# Patient Record
Sex: Female | Born: 1980 | Race: White | Hispanic: No | Marital: Married | Smoking: Never smoker
Health system: Southern US, Community
[De-identification: ages and names within clinical notes are randomized; demographics above are authoritative.]

## PROBLEM LIST (undated history)

## (undated) ENCOUNTER — Inpatient Hospital Stay (HOSPITAL_COMMUNITY): Payer: Self-pay

## (undated) DIAGNOSIS — G43909 Migraine, unspecified, not intractable, without status migrainosus: Secondary | ICD-10-CM

## (undated) DIAGNOSIS — O09529 Supervision of elderly multigravida, unspecified trimester: Secondary | ICD-10-CM

## (undated) DIAGNOSIS — Z8619 Personal history of other infectious and parasitic diseases: Secondary | ICD-10-CM

## (undated) HISTORY — DX: Personal history of other infectious and parasitic diseases: Z86.19

## (undated) HISTORY — DX: Migraine, unspecified, not intractable, without status migrainosus: G43.909

## (undated) HISTORY — DX: Supervision of elderly multigravida, unspecified trimester: O09.529

## (undated) HISTORY — PX: WISDOM TOOTH EXTRACTION: SHX21

---

## 2003-02-15 ENCOUNTER — Emergency Department (HOSPITAL_COMMUNITY): Admission: EM | Admit: 2003-02-15 | Discharge: 2003-02-15 | Payer: Self-pay | Admitting: Emergency Medicine

## 2010-04-29 ENCOUNTER — Other Ambulatory Visit: Admission: RE | Admit: 2010-04-29 | Discharge: 2010-04-29 | Payer: Self-pay | Admitting: Gynecology

## 2010-04-29 ENCOUNTER — Ambulatory Visit: Payer: Self-pay | Admitting: Gynecology

## 2010-05-05 ENCOUNTER — Ambulatory Visit: Payer: Self-pay | Admitting: Gynecology

## 2010-05-20 ENCOUNTER — Emergency Department (HOSPITAL_COMMUNITY): Admission: EM | Admit: 2010-05-20 | Discharge: 2010-05-20 | Payer: Self-pay | Admitting: Emergency Medicine

## 2012-02-15 ENCOUNTER — Encounter: Payer: Self-pay | Admitting: *Deleted

## 2012-02-16 ENCOUNTER — Other Ambulatory Visit (HOSPITAL_COMMUNITY)
Admission: RE | Admit: 2012-02-16 | Discharge: 2012-02-16 | Disposition: A | Discharge status: Home / Self Care | Payer: BC Managed Care – PPO | Source: Ambulatory Visit | Attending: Gynecology | Admitting: Gynecology

## 2012-02-16 ENCOUNTER — Ambulatory Visit (INDEPENDENT_AMBULATORY_CARE_PROVIDER_SITE_OTHER): Payer: BC Managed Care – PPO | Admitting: Gynecology

## 2012-02-16 ENCOUNTER — Encounter: Payer: Self-pay | Admitting: Gynecology

## 2012-02-16 VITALS — BP 112/64 | Ht 66.5 in | Wt 149.0 lb

## 2012-02-16 DIAGNOSIS — Z01419 Encounter for gynecological examination (general) (routine) without abnormal findings: Secondary | ICD-10-CM

## 2012-02-16 DIAGNOSIS — Z131 Encounter for screening for diabetes mellitus: Secondary | ICD-10-CM

## 2012-02-16 DIAGNOSIS — Z13220 Encounter for screening for lipoid disorders: Secondary | ICD-10-CM

## 2012-02-16 DIAGNOSIS — Z1151 Encounter for screening for human papillomavirus (HPV): Secondary | ICD-10-CM | POA: Insufficient documentation

## 2012-02-16 LAB — CBC WITH DIFFERENTIAL/PLATELET
Basophils Absolute: 0.1 10*3/uL (ref 0.0–0.1)
Basophils Relative: 1 % (ref 0–1)
Eosinophils Relative: 2 % (ref 0–5)
Lymphocytes Relative: 26 % (ref 12–46)
MCV: 96.9 fL (ref 78.0–100.0)
Neutro Abs: 3.3 10*3/uL (ref 1.7–7.7)
Platelets: 405 10*3/uL — ABNORMAL HIGH (ref 150–400)
RDW: 13.5 % (ref 11.5–15.5)
WBC: 5.8 10*3/uL (ref 4.0–10.5)

## 2012-02-16 LAB — LIPID PANEL
LDL Cholesterol: 165 mg/dL — ABNORMAL HIGH (ref 0–99)
Triglycerides: 50 mg/dL (ref <150)

## 2012-02-16 NOTE — Patient Instructions (Signed)
Call us when you're ready to start BCPs. Follow up in one year for annual gynecologic exam.  Oral Contraception Use Oral contraceptives (OCs) are medicines taken to prevent pregnancy. OCs work by preventing the ovaries from releasing eggs. The hormones in OCs also cause the cervical mucus to thicken, preventing the sperm from entering the uterus. The hormones also cause the uterine lining to become thin, not allowing a fertilized egg to attach to the inside of the uterus. OCs are highly effective when taken exactly as prescribed. However, OCs do not prevent sexually transmitted diseases (STDs). Safe sex practices, such as using condoms along with an OC, can help prevent STDs.  Before taking OCs, you may have a physical exam and Pap test. Your caregiver may also order blood tests if necessary. Your caregiver will make sure you are a good candidate for oral contraception. Discuss with your caregiver the possible side effects of the OC you may be prescribed. When starting an OC, it can take 2 to 3 months for the body to adjust to the changes in hormone levels in your body.  HOW TO TAKE ORAL CONTRACEPTIVES Your caregiver may advise you on how to start taking the first cycle of OCs. Otherwise, you can:  Start on day 1 of your menstrual period. You will not need any backup contraceptive protection with this start time.   Start on the first Sunday after your menstrual period or the day you get your prescription. In these cases, you will need to use backup contraceptive protection for the first 7-day cycle.  After you have started taking OCs:  If you forget to take 1 pill, take it as soon as you remember. Take the next pill at the regular time.   If you miss 2 or more pills, use backup birth control until your next menstrual period starts.   If you use a 28-day pack that contains inactive pills and you miss 1 of the last 7 pills (pills with no hormones), it will not matter. Throw away the rest of the  non-hormone pills and start a new pill pack.  No matter which day you start the OC, you will always start a new pack on that same day of the week. Have an extra pack of OCs and a backup contraceptive method available in case you miss some pills or lose your OC pack. HOME CARE INSTRUCTIONS   Do not smoke.   Always use a condom to protect against STDs. OCs do not protect against STDs.   Use a calendar to mark your menstrual period days.   Read the information and directions that come with your OC. Talk to your caregiver if you have questions.  SEEK MEDICAL CARE IF:   You develop nausea and vomiting.   You have abnormal vaginal discharge or bleeding.   You develop a rash.   You miss your menstrual period.   You are losing your hair.   You need treatment for mood swings or depression.   You get dizzy when taking the OC.   You develop acne from taking the OC.   You become pregnant.  SEEK IMMEDIATE MEDICAL CARE IF:   You develop chest pain.   You develop shortness of breath.   You have an uncontrolled or severe headache.   You develop numbness or slurred speech.   You develop visual problems.   You develop pain, redness, and swelling in the legs.  Document Released: 06/22/2011 Document Reviewed: 06/20/2011 [facility] Patient Information 2012 [employer]. 

## 2012-02-16 NOTE — Progress Notes (Signed)
Debra Larsen [birth year] [id number]        31 y.o.  G0P0 for annual exam.  Without complaints.  Past medical history,surgical history, medications, allergies, family history and social history were all reviewed and documented in the EPIC chart. ROS:  Was performed and pertinent positives and negatives are included in the history.  Exam: Sherrilyn Rist assistant Filed Vitals:   02/16/12 0937  BP: 112/64  Height: 5' 6.5" (1.689 m)  Weight: 149 lb (67.586 kg)   General appearance  Normal Skin grossly normal Head/Neck normal with no cervical or supraclavicular adenopathy thyroid normal Lungs  clear Cardiac RR, without RMG Abdominal  soft, nontender, without masses, organomegaly or hernia Breasts  examined lying and sitting without masses, retractions, discharge or axillary adenopathy. Pelvic  Ext/BUS/vagina  normal   Cervix  normal Pap/HPV  Uterus  anteverted, normal size, shape and contour, midline and mobile nontender   Adnexa  Without masses or tenderness    Anus and perineum  normal   Rectovaginal  normal sphincter tone without palpated masses or tenderness.    Assessment/Plan:  31 y.o. G0P0 female for annual exam, regular menses.   1. Contraceptive counseling. Patient currently not sexually active but wants to start BCPs sometime this year. I reviewed all options for contraception to include pill patch ring Depo-Provera Implanon IUD. Side effect profile and risks of BCPs reviewed to include stroke heart attack DVT. We'll plan on Loestrin 120 of 1. She does not want prescribed now but will call us during the year she is ready to start to be prescribed. Sunday start/first a start reviewed. Every other month withdrawal options/offbrand labeling reviewed. 2. Breast health. Ischemically reviewed. 3. Pap smear. Pap/HPV done today. No history of abnormal Paps before. If negative then plan every 5 year screening. 4. Health maintenance. Baseline CBC glucose lipid profile urinalysis done today.  Follow up in one year, sooner as needed.    [name], 10:02 AM 02/16/2012

## 2012-02-17 LAB — URINALYSIS W MICROSCOPIC + REFLEX CULTURE
Casts: NONE SEEN
Crystals: NONE SEEN
Ketones, ur: NEGATIVE mg/dL
Nitrite: NEGATIVE
Specific Gravity, Urine: 1.012 (ref 1.005–1.030)
pH: 7.5 (ref 5.0–8.0)

## 2012-02-18 LAB — URINE CULTURE

## 2012-02-19 ENCOUNTER — Telehealth: Payer: Self-pay | Admitting: Gynecology

## 2012-02-19 DIAGNOSIS — E7800 Pure hypercholesterolemia, unspecified: Secondary | ICD-10-CM

## 2012-02-19 DIAGNOSIS — Z13220 Encounter for screening for lipoid disorders: Secondary | ICD-10-CM

## 2012-02-19 NOTE — Telephone Encounter (Signed)
Tell patient 1. Cholesterol and LDL are too high. Need to recheck a fasting lipid profile. 2. Urinalysis contaminated and recommend repeating a clean catch urinalysis. She can do this when she comes in for her lipid profile.

## 2012-02-19 NOTE — Telephone Encounter (Signed)
Left message for pt to call.

## 2012-02-19 NOTE — Telephone Encounter (Signed)
Patient called in voice mail returning call.  I called her back and told her I needed to discuss her lipid profile and u/a results.

## 2012-02-21 NOTE — Telephone Encounter (Signed)
Pt informed with the below note, orders placed in computer.

## 2013-02-27 ENCOUNTER — Encounter: Payer: Self-pay | Admitting: Gynecology

## 2013-02-27 ENCOUNTER — Ambulatory Visit (INDEPENDENT_AMBULATORY_CARE_PROVIDER_SITE_OTHER): Payer: BC Managed Care – PPO | Admitting: Gynecology

## 2013-02-27 VITALS — BP 116/70 | Ht 67.0 in | Wt 145.0 lb

## 2013-02-27 DIAGNOSIS — Z01419 Encounter for gynecological examination (general) (routine) without abnormal findings: Secondary | ICD-10-CM

## 2013-02-27 DIAGNOSIS — Z113 Encounter for screening for infections with a predominantly sexual mode of transmission: Secondary | ICD-10-CM

## 2013-02-27 DIAGNOSIS — Z13220 Encounter for screening for lipoid disorders: Secondary | ICD-10-CM

## 2013-02-27 LAB — HEPATITIS B SURFACE ANTIGEN: Hepatitis B Surface Ag: NEGATIVE

## 2013-02-27 LAB — CBC WITH DIFFERENTIAL/PLATELET
HCT: 41.8 % (ref 36.0–46.0)
Hemoglobin: 14 g/dL (ref 12.0–15.0)
Lymphocytes Relative: 38 % (ref 12–46)
Monocytes Absolute: 0.5 10*3/uL (ref 0.1–1.0)
Monocytes Relative: 12 % (ref 3–12)
Neutro Abs: 2 10*3/uL (ref 1.7–7.7)
Neutrophils Relative %: 45 % (ref 43–77)
RBC: 4.48 MIL/uL (ref 3.87–5.11)
WBC: 4.3 10*3/uL (ref 4.0–10.5)

## 2013-02-27 LAB — LIPID PANEL
HDL: 66 mg/dL (ref >39)
LDL Cholesterol: 170 mg/dL — ABNORMAL HIGH (ref 0–99)
Triglycerides: 46 mg/dL (ref <150)

## 2013-02-27 LAB — COMPREHENSIVE METABOLIC PANEL
AST: 13 U/L (ref 0–37)
Albumin: 4.3 g/dL (ref 3.5–5.2)
Alkaline Phosphatase: 49 U/L (ref 39–117)
BUN: 10 mg/dL (ref 6–23)
Calcium: 9.2 mg/dL (ref 8.4–10.5)
Chloride: 102 mEq/L (ref 96–112)
Glucose, Bld: 76 mg/dL (ref 70–99)
Potassium: 4.1 mEq/L (ref 3.5–5.3)
Sodium: 138 mEq/L (ref 135–145)
Total Protein: 7 g/dL (ref 6.0–8.3)

## 2013-02-27 LAB — RPR

## 2013-02-27 LAB — VITAMIN B12: Vitamin B-12: 980 pg/mL — ABNORMAL HIGH (ref 211–911)

## 2013-02-27 LAB — HIV ANTIBODY (ROUTINE TESTING W REFLEX): HIV: NONREACTIVE

## 2013-02-27 NOTE — Patient Instructions (Signed)
Check for your lab results on My Chart. Followup in one year for annual exam.

## 2013-02-27 NOTE — Addendum Note (Signed)
Addended by: Dara Lords on: 02/27/2013 12:10 PM   Modules accepted: Level of Service

## 2013-02-27 NOTE — Progress Notes (Signed)
Debra Larsen January 29, 1981 [id number]        32 y.o.  G0P0 for annual exam.  Doing well without complaints.  Past medical history,surgical history, medications, allergies, family history and social history were all reviewed and documented in the EPIC chart.  ROS:  Performed and pertinent positives and negatives are included in the history, assessment and plan .  Exam: [name] assistant Filed Vitals:   02/27/13 1134  BP: 116/70  Height: 5\' 7"  (1.702 m)  Weight: 145 lb (65.772 kg)   General appearance  Normal Skin grossly normal Head/Neck normal with no cervical or supraclavicular adenopathy thyroid normal Lungs  clear Cardiac RR, without RMG Abdominal  soft, nontender, without masses, organomegaly or hernia Breasts  examined lying and sitting without masses, retractions, discharge or axillary adenopathy. Pelvic  Ext/BUS/vagina  normal   Cervix  normal   Uterus  anteverted, normal size, shape and contour, midline and mobile nontender   Adnexa  Without masses or tenderness    Anus and perineum  normal      Assessment/Plan:  32 y.o. G0P0 female for annual exam, regular menses, condom contraception.   1. Birth control. Patient had initially planned on starting low-dose oral contraceptives this past year but never did that. She continues to use condoms. I reviewed the failure risk with these and we discussed birth control options. She declines and is comfortable with condoms. Availability of plan B. also discussed. 2. Hypercholesterolemia. Patient's cholesterol was elevated last year and she was asked to come back for fasting value but she never did. We'll recheck a fasting lipid profile today. 3. Pap smear/HPV 2013 normal. No history of abnormal Pap smears previously. Plan repeat Pap smear in 3-5 year interval. 4. STD screening requested. No known exposure but wants to be screened. GC/Chlamydia, HIV, RPR, hepatitis B, hepatitis C ordered. Issues with HSV/HPV discussed. 5. SBE monthly  reviewed. 6. Health maintenance. Baseline CBC comprehensive metabolic panel lipid profile urinalysis ordered. Patient requested vitamin D and B12 levels to be checked also. Followup one year, sooner as needed.  Note: This document was prepared with digital dictation and possible smart phrase technology. Any transcriptional errors that result from this process are unintentional.   [name], 11:58 AM 02/27/2013

## 2013-02-28 ENCOUNTER — Other Ambulatory Visit: Payer: Self-pay | Admitting: Gynecology

## 2013-02-28 DIAGNOSIS — E7800 Pure hypercholesterolemia, unspecified: Secondary | ICD-10-CM

## 2013-02-28 LAB — URINALYSIS W MICROSCOPIC + REFLEX CULTURE
Casts: NONE SEEN
Crystals: NONE SEEN
Glucose, UA: NEGATIVE mg/dL
Hgb urine dipstick: NEGATIVE
Leukocytes, UA: NEGATIVE
Nitrite: NEGATIVE
Specific Gravity, Urine: 1.005 (ref 1.005–1.030)
pH: 7 (ref 5.0–8.0)

## 2013-02-28 LAB — VITAMIN D 25 HYDROXY (VIT D DEFICIENCY, FRACTURES): Vit D, 25-Hydroxy: 38 ng/mL (ref 30–89)

## 2013-02-28 LAB — GC/CHLAMYDIA PROBE AMP: CT Probe RNA: NEGATIVE

## 2013-03-03 ENCOUNTER — Other Ambulatory Visit: Payer: BC Managed Care – PPO

## 2013-03-19 ENCOUNTER — Other Ambulatory Visit: Payer: BC Managed Care – PPO

## 2013-05-22 ENCOUNTER — Other Ambulatory Visit: Payer: Self-pay

## 2014-05-27 LAB — OB RESULTS CONSOLE ABO/RH: RH TYPE: POSITIVE

## 2014-05-27 LAB — OB RESULTS CONSOLE HEPATITIS B SURFACE ANTIGEN: Hepatitis B Surface Ag: NEGATIVE

## 2014-05-27 LAB — OB RESULTS CONSOLE RUBELLA ANTIBODY, IGM: Rubella: IMMUNE

## 2014-05-27 LAB — OB RESULTS CONSOLE GC/CHLAMYDIA
Chlamydia: NEGATIVE
GC PROBE AMP, GENITAL: NEGATIVE

## 2014-05-27 LAB — OB RESULTS CONSOLE HIV ANTIBODY (ROUTINE TESTING): HIV: NONREACTIVE

## 2014-05-27 LAB — OB RESULTS CONSOLE ANTIBODY SCREEN: ANTIBODY SCREEN: NEGATIVE

## 2014-05-27 LAB — OB RESULTS CONSOLE RPR: RPR: NONREACTIVE

## 2014-06-16 ENCOUNTER — Other Ambulatory Visit: Payer: Self-pay | Admitting: Obstetrics & Gynecology

## 2014-06-17 LAB — CYTOLOGY - PAP

## 2014-07-17 NOTE — L&D Delivery Note (Signed)
Delivery Note At 2:45 PM a viable female was delivered via OA PResentation Apgars 9 9    Placenta status:spontaneously and intact , .  Cord:  with the following complications:none .  Cord pH: not obtained  Anesthesia: epidural   Episiotomy:none   Lacerations:  2nd Suture Repair: 3.0 chromic Est. Blood Loss (mL):  400  Mom to postpartum.  Baby to Couplet care / Skin to Skin.  Debra Larsen L 11/21/2014, 2:55 PM

## 2014-10-04 ENCOUNTER — Inpatient Hospital Stay (HOSPITAL_COMMUNITY)
Admission: AD | Admit: 2014-10-04 | Discharge: 2014-10-04 | Disposition: A | Discharge status: Home / Self Care | Payer: BC Managed Care – PPO | Source: Ambulatory Visit | Attending: Obstetrics and Gynecology | Admitting: Obstetrics and Gynecology

## 2014-10-04 ENCOUNTER — Encounter (HOSPITAL_COMMUNITY): Payer: Self-pay | Admitting: *Deleted

## 2014-10-04 DIAGNOSIS — O9989 Other specified diseases and conditions complicating pregnancy, childbirth and the puerperium: Secondary | ICD-10-CM | POA: Diagnosis not present

## 2014-10-04 DIAGNOSIS — N898 Other specified noninflammatory disorders of vagina: Secondary | ICD-10-CM | POA: Insufficient documentation

## 2014-10-04 DIAGNOSIS — O26893 Other specified pregnancy related conditions, third trimester: Secondary | ICD-10-CM

## 2014-10-04 DIAGNOSIS — Z3A33 33 weeks gestation of pregnancy: Secondary | ICD-10-CM | POA: Insufficient documentation

## 2014-10-04 LAB — URINALYSIS, ROUTINE W REFLEX MICROSCOPIC
BILIRUBIN URINE: NEGATIVE
Glucose, UA: NEGATIVE mg/dL
HGB URINE DIPSTICK: NEGATIVE
KETONES UR: NEGATIVE mg/dL
NITRITE: NEGATIVE
PH: 6 (ref 5.0–8.0)
PROTEIN: NEGATIVE mg/dL
Specific Gravity, Urine: <1.005 — ABNORMAL LOW (ref 1.005–1.030)
Urobilinogen, UA: 0.2 mg/dL (ref 0.0–1.0)

## 2014-10-04 LAB — URINE MICROSCOPIC-ADD ON

## 2014-10-04 NOTE — MAU Note (Signed)
Has noticed increased leaking over past few days.

## 2014-10-04 NOTE — MAU Provider Note (Signed)
History     CSN: [id number]  Arrival date and time: 10/04/14 1046   First Provider Initiated Contact with Patient 10/04/14 1134      Chief Complaint  Patient presents with  . leakage of fluid    HPI 34 y.o. G1P0 at 8063w5d w/ increased vaginal discharge only in the morning for the past few days. No pain or bleeding, + fetal movement, uncomplicated prenatal course.   Past Medical History  Diagnosis Date  . Migraine     History reviewed. No pertinent past surgical history.  Family History  Problem Relation Age of Onset  . Diabetes Maternal Grandmother     History  Substance Use Topics  . Smoking status: Never Smoker   . Smokeless tobacco: Never Used  . Alcohol Use: Yes     Comment: Rare    Allergies:  Allergies  Allergen Reactions  . Other     Family HX of reactions to vaccines--Pt.chooses not to be vaccinated due to HX--Severe nerve damage in mother     Prescriptions prior to admission  Medication Sig Dispense Refill Last Dose  . Multiple Vitamin (MULTIVITAMIN) tablet Take 1 tablet by mouth daily.   Taking    Review of Systems  Constitutional: Negative.   Respiratory: Negative.   Cardiovascular: Negative.   Gastrointestinal: Negative for nausea, vomiting, abdominal pain, diarrhea and constipation.  Genitourinary: Negative for dysuria, urgency, frequency, hematuria and flank pain.       Negative for vaginal bleeding, cramping/contractions, + discharge   Musculoskeletal: Negative.   Neurological: Negative.   Psychiatric/Behavioral: Negative.    Physical Exam   Blood pressure 115/75, pulse 70, temperature 98.1 F (36.7 C), temperature source Oral, resp. rate 18, height 5\' 7"  (1.702 m), weight 171 lb (77.565 kg).  Physical Exam  Nursing note and vitals reviewed. Constitutional: She is oriented to person, place, and time. She appears well-developed and well-nourished. No distress.  HENT:  Head: Normocephalic and atraumatic.  Cardiovascular: Normal rate.    Respiratory: Effort normal.  GI: Soft. She exhibits no mass. There is no tenderness. There is no rebound and no guarding.  Genitourinary: There is no rash or lesion on the right labia. There is no rash or lesion on the left labia. Uterus is not tender. Enlarged: Size c/w dates. No tenderness or bleeding in the vagina. Vaginal discharge (small mucous) found.  Cervix visually closed   Musculoskeletal: Normal range of motion.  Neurological: She is alert and oriented to person, place, and time.  Skin: Skin is warm and dry.  Psychiatric: She has a normal mood and affect.    MAU Course  Procedures Results for orders placed or performed during the hospital encounter of 10/04/14 (from the past 24 hour(s))  Urinalysis, Routine w reflex microscopic     Status: Abnormal   Collection Time: 10/04/14 10:55 AM  Result Value Ref Range   Color, Urine YELLOW YELLOW   APPearance CLEAR CLEAR   Specific Gravity, Urine <1.005 (L) 1.005 - 1.030   pH 6.0 5.0 - 8.0   Glucose, UA NEGATIVE NEGATIVE mg/dL   Hgb urine dipstick NEGATIVE NEGATIVE   Bilirubin Urine NEGATIVE NEGATIVE   Ketones, ur NEGATIVE NEGATIVE mg/dL   Protein, ur NEGATIVE NEGATIVE mg/dL   Urobilinogen, UA 0.2 0.0 - 1.0 mg/dL   Nitrite NEGATIVE NEGATIVE   Leukocytes, UA SMALL (A) NEGATIVE  Urine microscopic-add on     Status: Abnormal   Collection Time: 10/04/14 10:55 AM  Result Value Ref Range   Squamous Epithelial /  LPF FEW (A) RARE   WBC, UA 3-6 <3 WBC/hpf   RBC / HPF 0-2 <3 RBC/hpf   Bacteria, UA FEW (A) RARE   FERN: NEGATIVE  Assessment and Plan   1. Vaginal discharge in pregnancy, third trimester   No evidence of SROM, precautions rev'd.     Medication List    TAKE these medications        multivitamin tablet  Take 1 tablet by mouth daily.            Follow-up Information    Follow up with ADKINS,GRETCHEN, MD.   Specialty:  Obstetrics and Gynecology   Why:  as scheduled or sooner as needed   Contact  information:   [address] [place], [address] [place] [address] [phone]         [facility] 10/04/2014, 11:37 AM

## 2014-11-13 ENCOUNTER — Telehealth (HOSPITAL_COMMUNITY): Payer: Self-pay | Admitting: *Deleted

## 2014-11-13 ENCOUNTER — Encounter (HOSPITAL_COMMUNITY): Payer: Self-pay | Admitting: *Deleted

## 2014-11-13 LAB — OB RESULTS CONSOLE GBS: STREP GROUP B AG: NEGATIVE

## 2014-11-13 NOTE — Telephone Encounter (Signed)
Preadmission screen  

## 2014-11-20 ENCOUNTER — Inpatient Hospital Stay (HOSPITAL_COMMUNITY)
Admission: AD | Admit: 2014-11-20 | Discharge: 2014-11-22 | DRG: 775 | Disposition: A | Discharge status: Home / Self Care | Payer: BC Managed Care – PPO | Source: Ambulatory Visit | Attending: Obstetrics and Gynecology | Admitting: Obstetrics and Gynecology

## 2014-11-20 ENCOUNTER — Encounter (HOSPITAL_COMMUNITY): Payer: Self-pay | Admitting: *Deleted

## 2014-11-20 ENCOUNTER — Inpatient Hospital Stay (HOSPITAL_COMMUNITY): Admission: RE | Admit: 2014-11-20 | Payer: BC Managed Care – PPO | Source: Ambulatory Visit

## 2014-11-20 DIAGNOSIS — Z3403 Encounter for supervision of normal first pregnancy, third trimester: Secondary | ICD-10-CM | POA: Diagnosis present

## 2014-11-20 DIAGNOSIS — Z3A40 40 weeks gestation of pregnancy: Secondary | ICD-10-CM | POA: Diagnosis present

## 2014-11-20 DIAGNOSIS — O480 Post-term pregnancy: Principal | ICD-10-CM | POA: Diagnosis present

## 2014-11-20 LAB — CBC
HCT: 37 % (ref 36.0–46.0)
Hemoglobin: 12.6 g/dL (ref 12.0–15.0)
MCH: 32.2 pg (ref 26.0–34.0)
MCHC: 34.1 g/dL (ref 30.0–36.0)
MCV: 94.6 fL (ref 78.0–100.0)
Platelets: 288 10*3/uL (ref 150–400)
RBC: 3.91 MIL/uL (ref 3.87–5.11)
RDW: 15.2 % (ref 11.5–15.5)
WBC: 11.4 10*3/uL — ABNORMAL HIGH (ref 4.0–10.5)

## 2014-11-20 LAB — TYPE AND SCREEN
ABO/RH(D): O POS
Antibody Screen: NEGATIVE

## 2014-11-20 LAB — ABO/RH: ABO/RH(D): O POS

## 2014-11-20 MED ORDER — TERBUTALINE SULFATE 1 MG/ML IJ SOLN: 0.2500 mg | Freq: Once | INTRAMUSCULAR | Status: AC | PRN

## 2014-11-20 MED ORDER — OXYTOCIN 40 UNITS IN LACTATED RINGERS INFUSION - SIMPLE MED: 62.5000 mL/h | INTRAVENOUS | Status: DC

## 2014-11-20 MED ORDER — LACTATED RINGERS IV SOLN
INTRAVENOUS | Status: DC
Start: 2014-11-20 — End: 2014-11-21
  Administered 2014-11-21 (×2): via INTRAVENOUS

## 2014-11-20 MED ORDER — ZOLPIDEM TARTRATE 5 MG PO TABS: 5.0000 mg | ORAL_TABLET | Freq: Every evening | ORAL | Status: DC | PRN

## 2014-11-20 MED ORDER — LACTATED RINGERS IV SOLN
500.0000 mL | INTRAVENOUS | Status: DC | PRN
  Administered 2014-11-21: 500 mL via INTRAVENOUS

## 2014-11-20 MED ORDER — OXYCODONE-ACETAMINOPHEN 5-325 MG PO TABS: 2.0000 | ORAL_TABLET | ORAL | Status: DC | PRN

## 2014-11-20 MED ORDER — MISOPROSTOL 25 MCG QUARTER TABLET
25.0000 ug | ORAL_TABLET | ORAL | Status: DC
Start: 2014-11-20 — End: 2014-11-21
  Administered 2014-11-20 (×2): 25 ug via VAGINAL
  Filled 2014-11-20 (×3): qty 0.25

## 2014-11-20 MED ORDER — ACETAMINOPHEN 325 MG PO TABS: 650.0000 mg | ORAL_TABLET | ORAL | Status: DC | PRN

## 2014-11-20 MED ORDER — OXYTOCIN BOLUS FROM INFUSION
500.0000 mL | INTRAVENOUS | Status: DC
  Administered 2014-11-21: 500 mL via INTRAVENOUS

## 2014-11-20 MED ORDER — LIDOCAINE HCL (PF) 1 % IJ SOLN
30.0000 mL | INTRAMUSCULAR | Status: DC | PRN
  Administered 2014-11-21: 30 mL via SUBCUTANEOUS
  Filled 2014-11-20: qty 30

## 2014-11-20 MED ORDER — ONDANSETRON HCL 4 MG/2ML IJ SOLN: 4.0000 mg | Freq: Four times a day (QID) | INTRAMUSCULAR | Status: DC | PRN

## 2014-11-20 MED ORDER — OXYCODONE-ACETAMINOPHEN 5-325 MG PO TABS: 1.0000 | ORAL_TABLET | ORAL | Status: DC | PRN

## 2014-11-20 MED ORDER — CITRIC ACID-SODIUM CITRATE 334-500 MG/5ML PO SOLN
30.0000 mL | ORAL | Status: DC | PRN
  Administered 2014-11-21: 30 mL via ORAL
  Filled 2014-11-20: qty 15

## 2014-11-20 NOTE — MAU Note (Signed)
States she noted a gush of fluid around 1000. Having some contractions at night. States she is for induction tonight. GBS negative.

## 2014-11-20 NOTE — MAU Provider Note (Signed)
  History     CSN: [id number]  Arrival date and time: 11/20/14 1128   None     Chief Complaint  Patient presents with  . ? leaking    HPI 34 y.o. G1P0 @ 1171w3d presents to MAU stating that she had a gush of luid earlier today and thinks her water may be broke. No vaginal bleeding, contractions. Reports positive fetal movement. Pt did report she had sex last night  Past Medical History  Diagnosis Date  . Migraine   . Hx of varicella     Past Surgical History  Procedure Laterality Date  . Wisdom tooth extraction      Family History  Problem Relation Age of Onset  . Diabetes Maternal Grandmother     History  Substance Use Topics  . Smoking status: Never Smoker   . Smokeless tobacco: Never Used  . Alcohol Use: Yes     Comment: Rare    Allergies:  Allergies  Allergen Reactions  . Other     Family HX of reactions to vaccines--Pt.chooses not to be vaccinated due to HX--Severe nerve damage in mother     Prescriptions prior to admission  Medication Sig Dispense Refill Last Dose  . Prenatal Vit-Fe Fumarate-FA (PRENATAL MULTIVITAMIN) TABS tablet Take 1 tablet by mouth daily at 12 noon.   11/19/2014 at Unknown time    Review of Systems  Genitourinary:       Vaginal discharge  All other systems reviewed and are negative.  Physical Exam   Blood pressure 123/72, pulse 83, temperature 98.2 F (36.8 C), temperature source Oral, resp. rate 18, height 5\' 7"  (1.702 m), weight 80.287 kg (177 lb).  Physical Exam  Nursing note and vitals reviewed. Constitutional: She is oriented to person, place, and time. She appears well-developed and well-nourished. No distress.  HENT:  Head: Normocephalic and atraumatic.  Neck: Normal range of motion.  Cardiovascular: Normal rate.   Respiratory: Effort normal. No respiratory distress.  GI: Soft.  Genitourinary: Vagina normal.  No pooling noted on spec exam  Musculoskeletal: Normal range of motion.  Neurological: She is alert and  oriented to person, place, and time.  Skin: Skin is warm and dry.  Psychiatric: She has a normal mood and affect. Her behavior is normal. Judgment and thought content normal.    MAU Course  Procedures  MDM Fern Test -Negative; Pt was iniitally being seen by RN but was unable to determine membrane status No results found for this or any previous visit (from the past 24 hour(s)).  Assessment and Plan  R/O Srom   [facility] Discharge home today   [facility] DetroitClemmons,Velvie Thomaston Grissett 11/20/2014, 12:55 PM

## 2014-11-20 NOTE — MAU Note (Signed)
Urine in lab 

## 2014-11-20 NOTE — H&P (Signed)
Debra Larsen is a 34 y.o. G 1 P 0 at 40 w 3 days presents for post dates induction. History OB History    Gravida Para Term Preterm AB TAB SAB Ectopic Multiple Living   1         0     Past Medical History  Diagnosis Date  . Migraine   . Hx of varicella    Past Surgical History  Procedure Laterality Date  . Wisdom tooth extraction     Family History: family history includes Diabetes in her maternal grandmother. Social History:  reports that she has never smoked. She has never used smokeless tobacco. She reports that she drinks alcohol. She reports that she does not use illicit drugs.   Prenatal Transfer Tool  Maternal Diabetes: No Genetic Screening: Normal Maternal Ultrasounds/Referrals: Normal Fetal Ultrasounds or other Referrals:  None Maternal Substance Abuse:  No Significant Maternal Medications:  None Significant Maternal Lab Results:  None Other Comments:  None  Review of Systems  All other systems reviewed and are negative.   Dilation: 1 Effacement (%): 50 Station: -2 Exam by:: L Lamon RN Blood pressure 123/78, pulse 69, temperature 98.7 F (37.1 C), temperature source Oral, resp. rate 18, height 5\' 7"  (1.702 m), weight 80.287 kg (177 lb). Maternal Exam:  Uterine Assessment: Contraction frequency is irregular.   Abdomen: Fetal presentation: vertex  Introitus: Normal vulva. Normal vagina.    Fetal Exam Fetal State Assessment: Category I - tracings are normal.     Physical Exam  Nursing note and vitals reviewed. Constitutional: She appears well-developed.  HENT:  Head: Normocephalic.  Eyes: Pupils are equal, round, and reactive to light.  Neck: Normal range of motion.  Cardiovascular: Normal rate and regular rhythm.   Respiratory: Effort normal.  GI: Soft.    Prenatal labs: ABO, Rh: --/--/O POS (05/06 1405) Antibody: NEG (05/06 1405) Rubella: Immune (11/11 0000) RPR: Nonreactive (11/11 0000)  HBsAg: Negative (11/11 0000)  HIV: Non-reactive  (11/11 0000)  GBS: Negative (04/29 0000)   Assessment/Plan: IUP at 40 w 3 days  Post dates induction Cytotec tonight Risks reviewed  Kiron Osmun L 11/20/2014, 4:11 PM

## 2014-11-21 ENCOUNTER — Encounter (HOSPITAL_COMMUNITY): Payer: Self-pay | Admitting: *Deleted

## 2014-11-21 ENCOUNTER — Inpatient Hospital Stay (HOSPITAL_COMMUNITY): Payer: BC Managed Care – PPO | Admitting: Anesthesiology

## 2014-11-21 LAB — RPR: RPR Ser Ql: NONREACTIVE

## 2014-11-21 MED ORDER — WITCH HAZEL-GLYCERIN EX PADS: 1.0000 "application " | MEDICATED_PAD | CUTANEOUS | Status: DC | PRN

## 2014-11-21 MED ORDER — FLEET ENEMA 7-19 GM/118ML RE ENEM: 1.0000 | ENEMA | Freq: Every day | RECTAL | Status: DC | PRN

## 2014-11-21 MED ORDER — MEASLES, MUMPS & RUBELLA VAC ~~LOC~~ INJ
0.5000 mL | INJECTION | Freq: Once | SUBCUTANEOUS | Status: DC
  Filled 2014-11-21: qty 0.5

## 2014-11-21 MED ORDER — ONDANSETRON HCL 4 MG PO TABS: 4.0000 mg | ORAL_TABLET | ORAL | Status: DC | PRN

## 2014-11-21 MED ORDER — DIPHENHYDRAMINE HCL 25 MG PO CAPS: 25.0000 mg | ORAL_CAPSULE | Freq: Four times a day (QID) | ORAL | Status: DC | PRN

## 2014-11-21 MED ORDER — BENZOCAINE-MENTHOL 20-0.5 % EX AERO
1.0000 "application " | INHALATION_SPRAY | CUTANEOUS | Status: DC | PRN
  Administered 2014-11-21: 1 via TOPICAL
  Filled 2014-11-21: qty 56

## 2014-11-21 MED ORDER — MEDROXYPROGESTERONE ACETATE 150 MG/ML IM SUSP: 150.0000 mg | INTRAMUSCULAR | Status: DC | PRN

## 2014-11-21 MED ORDER — TETANUS-DIPHTH-ACELL PERTUSSIS 5-2.5-18.5 LF-MCG/0.5 IM SUSP: 0.5000 mL | Freq: Once | INTRAMUSCULAR | Status: DC

## 2014-11-21 MED ORDER — LIDOCAINE HCL (PF) 1 % IJ SOLN
INTRAMUSCULAR | Status: DC | PRN
  Administered 2014-11-21 (×2): 5 mL
  Administered 2014-11-21: 3 mL

## 2014-11-21 MED ORDER — PRENATAL MULTIVITAMIN CH
1.0000 | ORAL_TABLET | Freq: Every day | ORAL | Status: DC
  Filled 2014-11-21: qty 1

## 2014-11-21 MED ORDER — OXYCODONE-ACETAMINOPHEN 5-325 MG PO TABS: 1.0000 | ORAL_TABLET | ORAL | Status: DC | PRN

## 2014-11-21 MED ORDER — BISACODYL 10 MG RE SUPP: 10.0000 mg | Freq: Every day | RECTAL | Status: DC | PRN

## 2014-11-21 MED ORDER — EPHEDRINE 5 MG/ML INJ
10.0000 mg | INTRAVENOUS | Status: DC | PRN
  Filled 2014-11-21: qty 2

## 2014-11-21 MED ORDER — DIPHENHYDRAMINE HCL 50 MG/ML IJ SOLN: 12.5000 mg | INTRAMUSCULAR | Status: DC | PRN

## 2014-11-21 MED ORDER — SENNOSIDES-DOCUSATE SODIUM 8.6-50 MG PO TABS
2.0000 | ORAL_TABLET | ORAL | Status: DC
  Administered 2014-11-21: 2 via ORAL
  Filled 2014-11-21: qty 2

## 2014-11-21 MED ORDER — PHENYLEPHRINE 40 MCG/ML (10ML) SYRINGE FOR IV PUSH (FOR BLOOD PRESSURE SUPPORT)
80.0000 ug | PREFILLED_SYRINGE | INTRAVENOUS | Status: DC | PRN
  Filled 2014-11-21: qty 2
  Filled 2014-11-21: qty 20

## 2014-11-21 MED ORDER — FENTANYL 2.5 MCG/ML BUPIVACAINE 1/10 % EPIDURAL INFUSION (WH - ANES)
14.0000 mL/h | INTRAMUSCULAR | Status: DC | PRN
  Administered 2014-11-21 (×2): 14 mL/h via EPIDURAL
  Filled 2014-11-21: qty 125

## 2014-11-21 MED ORDER — ONDANSETRON HCL 4 MG/2ML IJ SOLN: 4.0000 mg | INTRAMUSCULAR | Status: DC | PRN

## 2014-11-21 MED ORDER — ZOLPIDEM TARTRATE 5 MG PO TABS: 5.0000 mg | ORAL_TABLET | Freq: Every evening | ORAL | Status: DC | PRN

## 2014-11-21 MED ORDER — OXYCODONE-ACETAMINOPHEN 5-325 MG PO TABS: 2.0000 | ORAL_TABLET | ORAL | Status: DC | PRN

## 2014-11-21 MED ORDER — DIBUCAINE 1 % RE OINT: 1.0000 "application " | TOPICAL_OINTMENT | RECTAL | Status: DC | PRN

## 2014-11-21 MED ORDER — IBUPROFEN 600 MG PO TABS
600.0000 mg | ORAL_TABLET | Freq: Four times a day (QID) | ORAL | Status: DC
  Administered 2014-11-21 – 2014-11-22 (×4): 600 mg via ORAL
  Filled 2014-11-21 (×4): qty 1

## 2014-11-21 MED ORDER — SIMETHICONE 80 MG PO CHEW: 80.0000 mg | CHEWABLE_TABLET | ORAL | Status: DC | PRN

## 2014-11-21 MED ORDER — LANOLIN HYDROUS EX OINT: TOPICAL_OINTMENT | CUTANEOUS | Status: DC | PRN

## 2014-11-21 MED ORDER — ACETAMINOPHEN 325 MG PO TABS: 650.0000 mg | ORAL_TABLET | ORAL | Status: DC | PRN

## 2014-11-21 MED ORDER — OXYTOCIN 40 UNITS IN LACTATED RINGERS INFUSION - SIMPLE MED
1.0000 m[IU]/min | INTRAVENOUS | Status: DC
  Administered 2014-11-21: 1 m[IU]/min via INTRAVENOUS
  Filled 2014-11-21: qty 1000

## 2014-11-21 NOTE — Anesthesia Procedure Notes (Signed)
Epidural Patient location during procedure: OB  Staffing Anesthesiologist: Phillips GroutARIGNAN, Natascha Edmonds Performed by: anesthesiologist   Preanesthetic Checklist Completed: patient identified, site marked, surgical consent, pre-op evaluation, timeout performed, IV checked, risks and benefits discussed and monitors and equipment checked  Epidural Patient position: sitting Prep: DuraPrep Patient monitoring: heart rate, continuous pulse ox and blood pressure Approach: midline Location: L4-L5 Injection technique: LOR saline  Needle:  Needle type: Tuohy  Needle gauge: 17 G Needle length: 9 cm and 9 Needle insertion depth: 5 cm Catheter type: closed end flexible Catheter size: 20 Guage Catheter at skin depth: 9 cm Test dose: negative  Assessment Events: blood not aspirated, injection not painful, no injection resistance, negative IV test and no paresthesia  Additional Notes   Patient tolerated the insertion well without complications.

## 2014-11-21 NOTE — Anesthesia Preprocedure Evaluation (Signed)
Anesthesia Evaluation  Patient identified by MRN, date of birth, ID band Patient awake    Reviewed: Allergy & Precautions, H&P , NPO status , Patient's Chart, lab work & pertinent test results  History of Anesthesia Complications Negative for: history of anesthetic complications  Airway Mallampati: II  TM Distance: >3 FB Neck ROM: full    Dental no notable dental hx. (+) Teeth Intact   Pulmonary neg pulmonary ROS,  breath sounds clear to auscultation  Pulmonary exam normal       Cardiovascular negative cardio ROS Normal cardiovascular examRhythm:regular Rate:Normal     Neuro/Psych negative neurological ROS  negative psych ROS   GI/Hepatic negative GI ROS, Neg liver ROS,   Endo/Other  negative endocrine ROS  Renal/GU negative Renal ROS  negative genitourinary   Musculoskeletal   Abdominal   Peds  Hematology negative hematology ROS (+)   Anesthesia Other Findings   Reproductive/Obstetrics (+) Pregnancy                             Anesthesia Physical Anesthesia Plan  ASA: II  Anesthesia Plan: Epidural   Post-op Pain Management:    Induction:   Airway Management Planned:   Additional Equipment:   Intra-op Plan:   Post-operative Plan:   Informed Consent: I have reviewed the patients History and Physical, chart, labs and discussed the procedure including the risks, benefits and alternatives for the proposed anesthesia with the patient or authorized representative who has indicated his/her understanding and acceptance.     Plan Discussed with:   Anesthesia Plan Comments:         Anesthesia Quick Evaluation  

## 2014-11-21 NOTE — Lactation Note (Signed)
This note was copied from the chart of Debra Larsen. Lactation Consultation Note  Patient Name: Debra Larsen ZHYQM'VToday's Date: 11/21/2014 Reason for consult: Initial assessment Assisted Mom with positioning and obtaining good depth with latch. Baby demonstrates a good rhythmic suck with few noted swallows. Encouraged Mom to BF with feeding ques, Basic teaching reviewed. Lactation brochure left for review, advised of OP services and support group. Questions answered. Encouraged Mom to call for assist as needed.   Maternal Data Has patient been taught Hand Expression?: Yes Does the patient have breastfeeding experience prior to this delivery?: No  Feeding Feeding Type: Breast Fed Length of feed: 12 min  LATCH Score/Interventions Latch: Grasps breast easily, tongue down, lips flanged, rhythmical sucking. Intervention(s): Adjust position;Assist with latch;Breast massage;Breast compression  Audible Swallowing: A few with stimulation Intervention(s): Skin to skin  Type of Nipple: Everted at rest and after stimulation  Comfort (Breast/Nipple): Soft / non-tender     Hold (Positioning): Assistance needed to correctly position infant at breast and maintain latch. Intervention(s): Breastfeeding basics reviewed;Support Pillows;Position options;Skin to skin  LATCH Score: 8  Lactation Tools Discussed/Used WIC Program: No   Consult Status Consult Status: Follow-up Date: 11/22/14 Follow-up type: In-patient    Alfred LevinsGranger, Atom Solivan Ann 11/21/2014, 10:48 PM

## 2014-11-21 NOTE — Progress Notes (Signed)
S:  Patient doing well. Reports cramping.  O: BP 144/93 mmHg  Pulse 92  Temp(Src) 98 F (36.7 C) (Oral)  Resp 16  Ht 5\' 7"  (1.702 m)  Wt 80.287 kg (177 lb)  BMI 27.72 kg/m2 FHR Category 1 On pitocin Cervix is 80% 2 cm -1 vertex AROM Clear fluid  IMPRESSION: IUP at 40 w 4 days POST DATES induction Continue pitocin Epidural prn

## 2014-11-22 LAB — CBC
HEMATOCRIT: 29.8 % — AB (ref 36.0–46.0)
Hemoglobin: 10.2 g/dL — ABNORMAL LOW (ref 12.0–15.0)
MCH: 32.3 pg (ref 26.0–34.0)
MCHC: 34.2 g/dL (ref 30.0–36.0)
MCV: 94.3 fL (ref 78.0–100.0)
Platelets: 240 10*3/uL (ref 150–400)
RBC: 3.16 MIL/uL — ABNORMAL LOW (ref 3.87–5.11)
RDW: 15.1 % (ref 11.5–15.5)
WBC: 17.9 10*3/uL — ABNORMAL HIGH (ref 4.0–10.5)

## 2014-11-22 NOTE — Lactation Note (Signed)
This note was copied from the chart of Debra Larsen. Lactation Consultation Note  Mom has baby latched to the breast when I went into room- just finishing feeding. Mom reports he has been latching well with no pain. Asking for help with her nursing pillow- shown how to use it. No questions at present. Reviewed BFSG and Op appointments as resources for support after DC. To call prn  Patient Name: Debra Larsen WGNFA'OToday's Date: 11/22/2014 Reason for consult: Follow-up assessment   Maternal Data Formula Feeding for Exclusion: No Does the patient have breastfeeding experience prior to this delivery?: No  Feeding Feeding Type: Breast Fed Length of feed: 20 min  LATCH Score/Interventions Latch: Grasps breast easily, tongue down, lips flanged, rhythmical sucking.  Audible Swallowing: None  Type of Nipple: Everted at rest and after stimulation  Comfort (Breast/Nipple): Soft / non-tender     Hold (Positioning): No assistance needed to correctly position infant at breast.  LATCH Score: 8  Lactation Tools Discussed/Used     Consult Status Consult Status: Complete    Pamelia HoitWeeks, Suanne Minahan D 11/22/2014, 9:23 AM

## 2014-11-22 NOTE — Discharge Summary (Signed)
Obstetric Discharge Summary Reason for Admission: induction of labor Prenatal Procedures: none Intrapartum Procedures: spontaneous vaginal delivery Postpartum Procedures: none Complications-Operative and Postpartum: 2nd degree perineal laceration HEMOGLOBIN  Date Value Ref Range Status  11/22/2014 10.2* 12.0 - 15.0 g/dL Final   HCT  Date Value Ref Range Status  11/22/2014 29.8* 36.0 - 46.0 % Final    Physical Exam:  General: alert, cooperative and appears stated age 21Lochia: appropriate Uterine Fundus: firm Incision: healing well, no significant drainage DVT Evaluation: No evidence of DVT seen on physical exam.  Discharge Diagnoses: Term Pregnancy-delivered  Discharge Information: Date: 11/22/2014 Activity: pelvic rest Diet: routine Medications: None Condition: stable Instructions: refer to practice specific booklet Discharge to: home   Newborn Data: Live born female  Birth Weight: 8 lb 12.4 oz (3980 g) APGAR: 9, 9  Home with mother.  Dawana Asper L 11/22/2014, 4:07 PM

## 2014-11-22 NOTE — Progress Notes (Signed)
S:  Patient doing well. Undecided about circ.  O:  BP 104/64 mmHg  Pulse 68  Temp(Src) 98.3 F (36.8 C) (Oral)  Resp 18  Ht 5\' 7"  (1.702 m)  Wt 80.287 kg (177 lb)  BMI 27.72 kg/m2  SpO2 100%  Breastfeeding? Unknown Results for orders placed or performed during the hospital encounter of 11/20/14 (from the past 24 hour(s))  CBC     Status: Abnormal   Collection Time: 11/22/14  6:04 AM  Result Value Ref Range   WBC 17.9 (H) 4.0 - 10.5 K/uL   RBC 3.16 (L) 3.87 - 5.11 MIL/uL   Hemoglobin 10.2 (L) 12.0 - 15.0 g/dL   HCT 16.129.8 (L) 09.636.0 - 04.546.0 %   MCV 94.3 78.0 - 100.0 fL   MCH 32.3 26.0 - 34.0 pg   MCHC 34.2 30.0 - 36.0 g/dL   RDW 40.915.1 81.111.5 - 91.415.5 %   Platelets 240 150 - 400 K/uL   Abdomen is soft and non tender Lochia normal  IMPRESSION: PPD #1 Doing well  PLAN: Routine care

## 2014-12-28 ENCOUNTER — Other Ambulatory Visit: Payer: Self-pay | Admitting: Obstetrics & Gynecology

## 2014-12-29 LAB — CYTOLOGY - PAP

## 2015-09-25 ENCOUNTER — Telehealth: Payer: Self-pay | Admitting: Obstetrics & Gynecology

## 2015-09-25 NOTE — Telephone Encounter (Signed)
Pt is 10 months post partum.  Still nursing but baby is eating table food as well.  Has experienced some bleeding the last two months.  This month she is having some cramping as well.  Was concerned this might be a problem so called.  Pt apologized for "bothering me on the weekend" as she felt she was going to get Dr. Audie BoxFontaine on the phone.  Advised I was happy to try and answer her questions.  D/W pt irregularity of cycles when still nursing as well as changes that can be experienced when bleeding returns.  Pt plans to stop nursing at 12 months.  Doesn't need anything for cramping--it's not bad enough for even tylenol or motrin.  No fevers.  No constipation.  No other GI symptoms.  Pt appreciative for phone call back.  All questions answered.

## 2015-12-20 LAB — OB RESULTS CONSOLE GC/CHLAMYDIA
Chlamydia: NEGATIVE
Gonorrhea: NEGATIVE

## 2015-12-20 LAB — OB RESULTS CONSOLE HIV ANTIBODY (ROUTINE TESTING): HIV: NONREACTIVE

## 2015-12-20 LAB — OB RESULTS CONSOLE ABO/RH: RH TYPE: POSITIVE

## 2015-12-20 LAB — OB RESULTS CONSOLE ANTIBODY SCREEN: Antibody Screen: NEGATIVE

## 2015-12-20 LAB — OB RESULTS CONSOLE RPR: RPR: NONREACTIVE

## 2015-12-20 LAB — OB RESULTS CONSOLE RUBELLA ANTIBODY, IGM: RUBELLA: IMMUNE

## 2015-12-20 LAB — OB RESULTS CONSOLE HEPATITIS B SURFACE ANTIGEN: Hepatitis B Surface Ag: NEGATIVE

## 2016-07-13 ENCOUNTER — Telehealth (HOSPITAL_COMMUNITY): Payer: Self-pay | Admitting: *Deleted

## 2016-07-13 ENCOUNTER — Encounter (HOSPITAL_COMMUNITY): Payer: Self-pay | Admitting: *Deleted

## 2016-07-13 LAB — OB RESULTS CONSOLE GBS: GBS: NEGATIVE

## 2016-07-13 NOTE — Telephone Encounter (Signed)
Preadmission screen  

## 2016-07-16 ENCOUNTER — Encounter (HOSPITAL_COMMUNITY): Payer: Self-pay

## 2016-07-16 ENCOUNTER — Inpatient Hospital Stay (HOSPITAL_COMMUNITY): Payer: Self-pay | Admitting: Anesthesiology

## 2016-07-16 ENCOUNTER — Inpatient Hospital Stay (HOSPITAL_COMMUNITY)
Admission: RE | Admit: 2016-07-16 | Discharge: 2016-07-17 | DRG: 775 | Disposition: A | Discharge status: Home / Self Care | Payer: Self-pay | Source: Ambulatory Visit | Attending: Obstetrics and Gynecology | Admitting: Obstetrics and Gynecology

## 2016-07-16 DIAGNOSIS — Z833 Family history of diabetes mellitus: Secondary | ICD-10-CM

## 2016-07-16 DIAGNOSIS — Z3A39 39 weeks gestation of pregnancy: Secondary | ICD-10-CM

## 2016-07-16 LAB — CBC
HCT: 37 % (ref 36.0–46.0)
HEMOGLOBIN: 12.6 g/dL (ref 12.0–15.0)
MCH: 31 pg (ref 26.0–34.0)
MCHC: 34.1 g/dL (ref 30.0–36.0)
MCV: 90.9 fL (ref 78.0–100.0)
Platelets: 395 10*3/uL (ref 150–400)
RBC: 4.07 MIL/uL (ref 3.87–5.11)
RDW: 13.7 % (ref 11.5–15.5)
WBC: 11.8 10*3/uL — ABNORMAL HIGH (ref 4.0–10.5)

## 2016-07-16 LAB — TYPE AND SCREEN
ABO/RH(D): O POS
ANTIBODY SCREEN: NEGATIVE

## 2016-07-16 LAB — RPR: RPR: NONREACTIVE

## 2016-07-16 MED ORDER — PHENYLEPHRINE 40 MCG/ML (10ML) SYRINGE FOR IV PUSH (FOR BLOOD PRESSURE SUPPORT)
80.0000 ug | PREFILLED_SYRINGE | INTRAVENOUS | Status: DC | PRN
  Filled 2016-07-16: qty 10
  Filled 2016-07-16: qty 5

## 2016-07-16 MED ORDER — EPHEDRINE 5 MG/ML INJ: 10.0000 mg | INTRAVENOUS | Status: DC | PRN

## 2016-07-16 MED ORDER — IBUPROFEN 600 MG PO TABS
600.0000 mg | ORAL_TABLET | Freq: Four times a day (QID) | ORAL | Status: DC
  Administered 2016-07-16 – 2016-07-17 (×4): 600 mg via ORAL
  Filled 2016-07-16 (×4): qty 1

## 2016-07-16 MED ORDER — DIPHENHYDRAMINE HCL 25 MG PO CAPS: 25.0000 mg | ORAL_CAPSULE | Freq: Four times a day (QID) | ORAL | Status: DC | PRN

## 2016-07-16 MED ORDER — OXYCODONE-ACETAMINOPHEN 5-325 MG PO TABS: 2.0000 | ORAL_TABLET | ORAL | Status: DC | PRN

## 2016-07-16 MED ORDER — OXYCODONE-ACETAMINOPHEN 5-325 MG PO TABS: 1.0000 | ORAL_TABLET | ORAL | Status: DC | PRN

## 2016-07-16 MED ORDER — TERBUTALINE SULFATE 1 MG/ML IJ SOLN
0.2500 mg | Freq: Once | INTRAMUSCULAR | Status: DC | PRN
  Filled 2016-07-16: qty 1

## 2016-07-16 MED ORDER — ONDANSETRON HCL 4 MG/2ML IJ SOLN
4.0000 mg | INTRAMUSCULAR | Status: DC | PRN
Start: 2016-07-16 — End: 2016-07-17

## 2016-07-16 MED ORDER — WITCH HAZEL-GLYCERIN EX PADS: 1.0000 "application " | MEDICATED_PAD | CUTANEOUS | Status: DC | PRN

## 2016-07-16 MED ORDER — LIDOCAINE HCL (PF) 1 % IJ SOLN
30.0000 mL | INTRAMUSCULAR | Status: DC | PRN
  Filled 2016-07-16: qty 30

## 2016-07-16 MED ORDER — PHENYLEPHRINE 40 MCG/ML (10ML) SYRINGE FOR IV PUSH (FOR BLOOD PRESSURE SUPPORT)
80.0000 ug | PREFILLED_SYRINGE | INTRAVENOUS | Status: DC | PRN
  Filled 2016-07-16: qty 5

## 2016-07-16 MED ORDER — BENZOCAINE-MENTHOL 20-0.5 % EX AERO
1.0000 "application " | INHALATION_SPRAY | CUTANEOUS | Status: DC | PRN
  Administered 2016-07-16: 1 via TOPICAL
  Filled 2016-07-16: qty 56

## 2016-07-16 MED ORDER — SOD CITRATE-CITRIC ACID 500-334 MG/5ML PO SOLN: 30.0000 mL | ORAL | Status: DC | PRN

## 2016-07-16 MED ORDER — PHENYLEPHRINE 40 MCG/ML (10ML) SYRINGE FOR IV PUSH (FOR BLOOD PRESSURE SUPPORT): 80.0000 ug | PREFILLED_SYRINGE | INTRAVENOUS | Status: DC | PRN

## 2016-07-16 MED ORDER — EPHEDRINE 5 MG/ML INJ
10.0000 mg | INTRAVENOUS | Status: DC | PRN
  Filled 2016-07-16: qty 4

## 2016-07-16 MED ORDER — BUTORPHANOL TARTRATE 1 MG/ML IJ SOLN: 1.0000 mg | INTRAMUSCULAR | Status: DC | PRN

## 2016-07-16 MED ORDER — TETANUS-DIPHTH-ACELL PERTUSSIS 5-2.5-18.5 LF-MCG/0.5 IM SUSP: 0.5000 mL | Freq: Once | INTRAMUSCULAR | Status: DC

## 2016-07-16 MED ORDER — ACETAMINOPHEN 325 MG PO TABS: 650.0000 mg | ORAL_TABLET | ORAL | Status: DC | PRN

## 2016-07-16 MED ORDER — COCONUT OIL OIL: 1.0000 "application " | TOPICAL_OIL | Status: DC | PRN

## 2016-07-16 MED ORDER — LACTATED RINGERS IV SOLN: 500.0000 mL | Freq: Once | INTRAVENOUS | Status: DC

## 2016-07-16 MED ORDER — PRENATAL MULTIVITAMIN CH
1.0000 | ORAL_TABLET | Freq: Every day | ORAL | Status: DC
  Filled 2016-07-16: qty 1

## 2016-07-16 MED ORDER — LACTATED RINGERS IV SOLN
INTRAVENOUS | Status: DC
  Administered 2016-07-16 (×2): 1000 mL via INTRAVENOUS

## 2016-07-16 MED ORDER — MEDROXYPROGESTERONE ACETATE 150 MG/ML IM SUSP: 150.0000 mg | INTRAMUSCULAR | Status: DC | PRN

## 2016-07-16 MED ORDER — OXYTOCIN 40 UNITS IN LACTATED RINGERS INFUSION - SIMPLE MED
1.0000 m[IU]/min | INTRAVENOUS | Status: DC
  Administered 2016-07-16: 2 m[IU]/min via INTRAVENOUS
  Filled 2016-07-16: qty 1000

## 2016-07-16 MED ORDER — OXYTOCIN 40 UNITS IN LACTATED RINGERS INFUSION - SIMPLE MED
2.5000 [IU]/h | INTRAVENOUS | Status: DC
Start: 2016-07-16 — End: 2016-07-16

## 2016-07-16 MED ORDER — ONDANSETRON HCL 4 MG/2ML IJ SOLN: 4.0000 mg | Freq: Four times a day (QID) | INTRAMUSCULAR | Status: DC | PRN

## 2016-07-16 MED ORDER — DIBUCAINE 1 % RE OINT
1.0000 "application " | TOPICAL_OINTMENT | RECTAL | Status: DC | PRN
  Administered 2016-07-16: 1 via RECTAL
  Filled 2016-07-16: qty 28

## 2016-07-16 MED ORDER — SENNOSIDES-DOCUSATE SODIUM 8.6-50 MG PO TABS
2.0000 | ORAL_TABLET | ORAL | Status: DC
  Administered 2016-07-16: 2 via ORAL
  Filled 2016-07-16: qty 2

## 2016-07-16 MED ORDER — ONDANSETRON HCL 4 MG PO TABS: 4.0000 mg | ORAL_TABLET | ORAL | Status: DC | PRN

## 2016-07-16 MED ORDER — LACTATED RINGERS IV SOLN: 500.0000 mL | INTRAVENOUS | Status: DC | PRN

## 2016-07-16 MED ORDER — DIPHENHYDRAMINE HCL 50 MG/ML IJ SOLN: 12.5000 mg | INTRAMUSCULAR | Status: DC | PRN

## 2016-07-16 MED ORDER — FENTANYL 2.5 MCG/ML BUPIVACAINE 1/10 % EPIDURAL INFUSION (WH - ANES)
14.0000 mL/h | INTRAMUSCULAR | Status: DC | PRN
  Administered 2016-07-16 (×2): 14 mL/h via EPIDURAL
  Filled 2016-07-16: qty 100

## 2016-07-16 MED ORDER — ZOLPIDEM TARTRATE 5 MG PO TABS: 5.0000 mg | ORAL_TABLET | Freq: Every evening | ORAL | Status: DC | PRN

## 2016-07-16 MED ORDER — MEASLES, MUMPS & RUBELLA VAC ~~LOC~~ INJ: 0.5000 mL | INJECTION | Freq: Once | SUBCUTANEOUS | Status: DC

## 2016-07-16 MED ORDER — LIDOCAINE HCL (PF) 1 % IJ SOLN
INTRAMUSCULAR | Status: DC | PRN
  Administered 2016-07-16: 4 mL via EPIDURAL

## 2016-07-16 MED ORDER — OXYTOCIN BOLUS FROM INFUSION: 500.0000 mL | Freq: Once | INTRAVENOUS | Status: DC

## 2016-07-16 MED ORDER — SIMETHICONE 80 MG PO CHEW: 80.0000 mg | CHEWABLE_TABLET | ORAL | Status: DC | PRN

## 2016-07-16 NOTE — Anesthesia Preprocedure Evaluation (Addendum)
Anesthesia Evaluation  Patient identified by MRN, date of birth, ID band Patient awake    Reviewed: Allergy & Precautions, NPO status , Patient's Chart, lab work & pertinent test results  Airway Mallampati: I       Dental  (+) Teeth Intact, Dental Advisory Given   Pulmonary neg pulmonary ROS,    breath sounds clear to auscultation       Cardiovascular negative cardio ROS   Rhythm:Regular Rate:Normal     Neuro/Psych  Headaches, negative psych ROS   GI/Hepatic negative GI ROS, Neg liver ROS,   Endo/Other  negative endocrine ROS  Renal/GU negative Renal ROS  negative genitourinary   Musculoskeletal negative musculoskeletal ROS (+)   Abdominal   Peds negative pediatric ROS (+)  Hematology negative hematology ROS (+)   Anesthesia Other Findings   Reproductive/Obstetrics (+) Pregnancy                            Lab Results  Component Value Date   WBC 11.8 (H) 07/16/2016   HGB 12.6 07/16/2016   HCT 37.0 07/16/2016   MCV 90.9 07/16/2016   PLT 395 07/16/2016   No results found for: INR, PROTIME   Anesthesia Physical Anesthesia Plan  ASA: II  Anesthesia Plan: Epidural   Post-op Pain Management:    Induction:   Airway Management Planned:   Additional Equipment:   Intra-op Plan:   Post-operative Plan:   Informed Consent: I have reviewed the patients History and Physical, chart, labs and discussed the procedure including the risks, benefits and alternatives for the proposed anesthesia with the patient or authorized representative who has indicated his/her understanding and acceptance.     Plan Discussed with:   Anesthesia Plan Comments:         Anesthesia Quick Evaluation

## 2016-07-16 NOTE — Progress Notes (Signed)
Pt comfortable w/ epidural  FHT cat 1 Toco irregular cvx 3/80/-2, clear fluid  A/P:  Continue IOL/pitocin

## 2016-07-16 NOTE — H&P (Signed)
Elissa HeftyLauren T Venable is a 35 y.o. female G2P1 @ 39wks presenting for IOL.  Pregnancy uncomplicated.  OB History    Gravida Para Term Preterm AB Living   2 1 1     1    SAB TAB Ectopic Multiple Live Births         0 1     Past Medical History:  Diagnosis Date  . AMA (advanced maternal age) multigravida 35+   . Hx of varicella   . Migraine    Past Surgical History:  Procedure Laterality Date  . WISDOM TOOTH EXTRACTION     Family History: family history includes Diabetes in her maternal grandmother. Social History:  reports that she has never smoked. She has never used smokeless tobacco. She reports that she drinks alcohol. She reports that she does not use drugs.     Maternal Diabetes: No Genetic Screening: Declined Maternal Ultrasounds/Referrals: Normal Fetal Ultrasounds or other Referrals:  None Maternal Substance Abuse:  No Significant Maternal Medications:  None Significant Maternal Lab Results:  None Other Comments:  None  ROS History Dilation: 2 Effacement (%): 80 Station: -2 Exam by:: Dr Renaldo FiddlerAdkins Blood pressure 121/77, pulse 76, temperature 97.8 F (36.6 C), temperature source Oral, resp. rate 18, height 5\' 7"  (1.702 m), weight 184 lb (83.5 kg), last menstrual period 10/11/2015, unknown if currently breastfeeding. Exam Physical Exam  Gen - NAD Abd - gravid, NT  EFW 7.5# Ext - NT, no edema Cvx 2/70/-2 AROM - clear Prenatal labs: ABO, Rh: O/Positive/-- (06/05 0000) Antibody: Negative (06/05 0000) Rubella: Immune (06/05 0000) RPR: Nonreactive (06/05 0000)  HBsAg: Negative (06/05 0000)  HIV: Non-reactive (06/05 0000)  GBS: Negative (12/28 0000)   Assessment/Plan: Admit Pitocin Epidural prn Exp mngt   Eleanor Gatliff 07/16/2016, 8:28 AM

## 2016-07-16 NOTE — Anesthesia Procedure Notes (Signed)
Epidural Patient location during procedure: OB Start time: 07/16/2016 9:37 AM End time: 07/16/2016 9:43 AM  Staffing Anesthesiologist: Shona SimpsonHOLLIS, Debra Reimann D Performed: anesthesiologist   Preanesthetic Checklist Completed: patient identified, site marked, surgical consent, pre-op evaluation, timeout performed, IV checked, risks and benefits discussed and monitors and equipment checked  Epidural Patient position: sitting Prep: ChloraPrep Patient monitoring: heart rate, continuous pulse ox and blood pressure Approach: midline Location: L3-L4 Injection technique: LOR saline  Needle:  Needle type: Tuohy  Needle gauge: 17 G Needle length: 9 cm Catheter type: closed end flexible Catheter size: 20 Guage Test dose: negative and 1.5% lidocaine  Assessment Events: blood not aspirated, injection not painful, no injection resistance and no paresthesia  Additional Notes LOR @ 5  Patient identified. Risks/Benefits/Options discussed with patient including but not limited to bleeding, infection, nerve damage, paralysis, failed block, incomplete pain control, headache, blood pressure changes, nausea, vomiting, reactions to medications, itching and postpartum back pain. Confirmed with bedside nurse the patient's most recent platelet count. Confirmed with patient that they are not currently taking any anticoagulation, have any bleeding history or any family history of bleeding disorders. Patient expressed understanding and wished to proceed. All questions were answered. Sterile technique was used throughout the entire procedure. Please see nursing notes for vital signs. Test dose was given through epidural catheter and negative prior to continuing to dose epidural or start infusion. Warning signs of high block given to the patient including shortness of breath, tingling/numbness in hands, complete motor block, or any concerning symptoms with instructions to call for help. Patient was given instructions on  fall risk and not to get out of bed. All questions and concerns addressed with instructions to call with any issues or inadequate analgesia.    Reason for block:procedure for pain

## 2016-07-16 NOTE — Progress Notes (Signed)
SVD of vigerous female infant w/ apgars of 9,9.  Placenta delivered spontaneous w/ 3VC.   2nd degree lac repaired w/ 3-0 vicryl rapide.  Fundus firm.  EBL 150cc .

## 2016-07-16 NOTE — Anesthesia Pain Management Evaluation Note (Signed)
  CRNA Pain Management Visit Note  Patient: Debra Larsen, 35 y.o., female  "Hello I am a member of the anesthesia team at [facility]. We have an anesthesia team available at all times to provide care throughout the hospital, including epidural management and anesthesia for C-section. I don't know your plan for the delivery whether it a natural birth, water birth, IV sedation, nitrous supplementation, doula or epidural, but we want to meet your pain goals."   1.Was your pain managed to your expectations on prior hospitalizations?   Yes   2.What is your expectation for pain management during this hospitalization?     Epidural  3.How can we help you reach that goal? epidural  Record the patient's initial score and the patient's pain goal.   Pain: 10  Pain Goal: 10 [facility] wants you to be able to say your pain was always managed very well.  Debra Larsen 07/16/2016

## 2016-07-16 NOTE — Progress Notes (Signed)
Pt comfortable w/ epidural  FHT  Cat 1 Toco Q2-4 Cvx 6/90/-1  A/P:  Continue exp mngt

## 2016-07-17 LAB — CBC
HCT: 33.2 % — ABNORMAL LOW (ref 36.0–46.0)
Hemoglobin: 11.1 g/dL — ABNORMAL LOW (ref 12.0–15.0)
MCH: 30.9 pg (ref 26.0–34.0)
MCHC: 33.4 g/dL (ref 30.0–36.0)
MCV: 92.5 fL (ref 78.0–100.0)
PLATELETS: 343 10*3/uL (ref 150–400)
RBC: 3.59 MIL/uL — AB (ref 3.87–5.11)
RDW: 13.9 % (ref 11.5–15.5)
WBC: 14.3 10*3/uL — AB (ref 4.0–10.5)

## 2016-07-17 MED ORDER — ACETAMINOPHEN 325 MG PO TABS: 650.0000 mg | ORAL_TABLET | Freq: Four times a day (QID) | ORAL | 0 refills | Status: DC | PRN

## 2016-07-17 MED ORDER — IBUPROFEN 600 MG PO TABS: 600.0000 mg | ORAL_TABLET | Freq: Four times a day (QID) | ORAL | 0 refills | Status: DC | PRN

## 2016-07-17 NOTE — Progress Notes (Signed)
Post Partum Day 1 Subjective: no complaints, up ad lib, voiding, tolerating PO and + flatus  Objective: Blood pressure 106/64, pulse 70, temperature 98.2 F (36.8 C), temperature source Oral, resp. rate 18, height 5\' 7"  (1.702 m), weight 184 lb (83.5 kg), last menstrual period 10/11/2015, SpO2 97 %, unknown if currently breastfeeding.  Physical Exam:  General: alert, cooperative and no distress Lochia: appropriate Uterine Fundus: firm Incision: healing well DVT Evaluation: No evidence of DVT seen on physical exam.   Recent Labs  07/16/16 0805 07/17/16 0654  HGB 12.6 11.1*  HCT 37.0 33.2*    Assessment/Plan: Discharge home   LOS: 1 day   Hertha Gergen II,Arlethia Basso E 07/17/2016, 8:14 AM

## 2016-07-17 NOTE — Plan of Care (Signed)
Problem: Nutritional: Goal: Mothers verbalization of comfort with breastfeeding process will improve Outcome: Not Applicable Date Met: 47/18/55 Mother is bottle/formula feeding infant

## 2016-07-17 NOTE — Discharge Summary (Signed)
Obstetric Discharge Summary Reason for Admission: induction of labor Prenatal Procedures: none Intrapartum Procedures: spontaneous vaginal delivery Postpartum Procedures: none Complications-Operative and Postpartum: none Hemoglobin  Date Value Ref Range Status  07/17/2016 11.1 (L) 12.0 - 15.0 g/dL Final   HCT  Date Value Ref Range Status  07/17/2016 33.2 (L) 36.0 - 46.0 % Final    Physical Exam:  General: alert, cooperative and no distress Lochia: appropriate Uterine Fundus: firm Incision: healing well DVT Evaluation: No evidence of DVT seen on physical exam.  Discharge Diagnoses: Term Pregnancy-delivered  Discharge Information: Date: 07/17/2016 Activity: pelvic rest Diet: routine Medications: PNV and Ibuprofen Condition: stable Instructions: refer to practice specific booklet Discharge to: home   Newborn Data: Live born female  Birth Weight: 7 lb 15 oz (3600 g) APGAR: 9, 9  Home with mother.  Brexton Sofia II,Fauna Neuner E 07/17/2016, 8:17 AM

## 2016-07-17 NOTE — Lactation Note (Addendum)
This note was copied from a baby's chart. Lactation Consultation Note  Patient Name: Boy Marca AnconaLauren Basil ZOXWR'UToday's Date: 07/17/2016 Reason for consult: Initial assessment;Other (Comment) (Early D/C [facility] and mother after 4:10 pm , 2 % weight loss , mom to page with feeding cues )  Baby is 21 hours old and has been to the breast consistently since birth. Voids and stools QS for age. Per mom last fed at 1245 for 10 mins, presently baby sound asleep in  Moms arms. LC recommended for mom to call with feeding cues for a latch assessment. Since this is an early D/C - LC reviewed sore nipple and engorgement prevention a and tx. Per mom has a manual pump and a DEBP at home.  Mom did request to be shown how to hand express. LC showed mom and mom returned demo with ease.  Mother informed of post-discharge support and given phone number to the lactation department, including services for phone call assistance; out-patient appointments; and breastfeeding support group. List of other breastfeeding resources in the community given in the handout. Encouraged mother to call for problems or concerns related to breastfeeding. LC updated doc flow sheets per mom.   Maternal Data Has patient been taught Hand Expression?: Yes (mom returned demo )  Feeding Feeding Type:  (baby sleepy , did not try to latch . mom to page with feeding cues ) Length of feed: 10 min (per mom )  LATCH Score/Interventions                      Lactation Tools Discussed/Used WIC Program: No   Consult Status Consult Status: Follow-up Date: 07/17/16 (encouraged mom to page with feeding cues ) Follow-up type: In-patient    Matilde SprangMargaret Ann Keyri Salberg 07/17/2016, 1:51 PM

## 2016-07-17 NOTE — Lactation Note (Addendum)
This note was copied from a baby's chart. Lactation Consultation Note  Patient Name: Debra Larsen [id number]'s Date: 07/17/2016 Reason for consult: Follow-up assessment Baby is 22 hours old , and just finished the left breast , and mom independently latched the baby on the right in cradle.  Mom declined pillow support. Multiple swallows noted. And per mom comfortable with feeding.  See early note for D/C teaching. This is an experienced breast feeding mother.   Maternal Data Has patient been taught Hand Expression?: Yes Does the patient have breastfeeding experience prior to this delivery?: Yes  Feeding Feeding Type: Breast Fed Length of feed:  (swallows noted )  LATCH Score/Interventions Latch: Grasps breast easily, tongue down, lips flanged, rhythmical sucking.  Audible Swallowing: Spontaneous and intermittent  Type of Nipple: Everted at rest and after stimulation  Comfort (Breast/Nipple): Soft / non-tender     Hold (Positioning): No assistance needed to correctly position infant at breast.  LATCH Score: 10  Lactation Tools Discussed/Used WIC Program: No   Consult Status Consult Status: Complete Date: 07/17/16 Follow-up type: In-patient    [name] 07/17/2016, 2:56 PM

## 2016-07-17 NOTE — Anesthesia Postprocedure Evaluation (Signed)
Anesthesia Post Note  Patient: Debra Larsen  Procedure(s) Performed: * No procedures listed *  Patient location during evaluation: Mother Baby Anesthesia Type: Epidural Level of consciousness: awake Pain management: satisfactory to patient Vital Signs Assessment: post-procedure vital signs reviewed and stable Respiratory status: spontaneous breathing Cardiovascular status: stable Anesthetic complications: no        Last Vitals:  Vitals:   07/17/16 0500 07/17/16 0735  BP: 106/64 111/78  Pulse: 70 63  Resp: 18 18  Temp: 36.8 C 36.6 C    Last Pain:  Vitals:   07/17/16 1155  TempSrc:   PainSc: 0-No pain   Pain Goal:                 KeyCorpBURGER,[name]

## 2016-07-17 NOTE — Discharge Instructions (Signed)
No vaginal entry No heavy lifting No operation automobiles x 1 week

## 2016-07-18 NOTE — Progress Notes (Signed)
Post discharge chart review completed.  

## 2016-11-18 ENCOUNTER — Inpatient Hospital Stay (HOSPITAL_COMMUNITY)
Admission: AD | Admit: 2016-11-18 | Payer: BC Managed Care – PPO | Source: Ambulatory Visit | Admitting: Obstetrics & Gynecology

## 2017-07-17 NOTE — L&D Delivery Note (Signed)
Delivery Note:   [id number] at 961w6d  Admit at 06/12/2018 11:03 AM for elective induction of labor at term, prodromal labor.  FHT category 1 in labor GBS negative Labor induction with oral Cytotec 50 mcg x 2 AROM clear AF at 2015 Complete dilation at 06/12/2018  2059 Onset of pushing at 2059 FHR second stage category 1  Analgesia /Anesthesia intrapartum:  labor support, hydrotherapy  Delivery of a Live born female on bed in L lateral over one contraction. OA to ROT, loose CAN x 1,  Birth Weight: 7lb 3.6oz   3277 gm APGAR: 9 ,9   Newborn Delivery   Birth date/time:  06/12/2018 21:01:00 Delivery type:       Nuchal Cord: Yes  Cord double clamped after cessation of pulsation, cut by FOB.  Collection of cord blood donation-  none Arterial cord blood sample-   none  Placenta delivered-  S/C/I with 3VC   . Placenta to L&D for disposal. Uterine tone firm after massage and moderate to light bleeding, oral Cytotec 400 mcg given for transient uterine atony.  1st degree perineal laceration identified.  Anesthesia:  lido 1% Labial Laceration:  none Repair:  3/0 vicryl in standard fashion Est. Blood Loss (mL): 200  Complications: none  Mom to postpartum.  Baby to Couplet care / Skin to Skin.  Delivery Report:  Review the Delivery Report for details.     Signed: Neta Mendsaniela C Paul, CNM, MSN 06/12/2018, 9:27 PM

## 2017-12-14 LAB — OB RESULTS CONSOLE RUBELLA ANTIBODY, IGM: RUBELLA: IMMUNE

## 2017-12-14 LAB — OB RESULTS CONSOLE HEPATITIS B SURFACE ANTIGEN: Hepatitis B Surface Ag: NEGATIVE

## 2017-12-14 LAB — OB RESULTS CONSOLE RPR: RPR: NONREACTIVE

## 2018-05-21 LAB — OB RESULTS CONSOLE GBS: GBS: NEGATIVE

## 2018-06-02 ENCOUNTER — Encounter (HOSPITAL_COMMUNITY): Payer: Self-pay

## 2018-06-02 ENCOUNTER — Inpatient Hospital Stay (HOSPITAL_COMMUNITY)
Admission: AD | Admit: 2018-06-02 | Discharge: 2018-06-02 | Disposition: A | Discharge status: Home / Self Care | Payer: BC Managed Care – PPO | Source: Ambulatory Visit | Attending: Obstetrics and Gynecology | Admitting: Obstetrics and Gynecology

## 2018-06-02 ENCOUNTER — Other Ambulatory Visit: Payer: Self-pay

## 2018-06-02 DIAGNOSIS — O471 False labor at or after 37 completed weeks of gestation: Secondary | ICD-10-CM | POA: Insufficient documentation

## 2018-06-02 DIAGNOSIS — O479 False labor, unspecified: Secondary | ICD-10-CM

## 2018-06-02 DIAGNOSIS — Z3490 Encounter for supervision of normal pregnancy, unspecified, unspecified trimester: Secondary | ICD-10-CM

## 2018-06-02 DIAGNOSIS — Z3A38 38 weeks gestation of pregnancy: Secondary | ICD-10-CM | POA: Insufficient documentation

## 2018-06-02 LAB — URINALYSIS, ROUTINE W REFLEX MICROSCOPIC
BILIRUBIN URINE: NEGATIVE
Glucose, UA: NEGATIVE mg/dL
HGB URINE DIPSTICK: NEGATIVE
KETONES UR: NEGATIVE mg/dL
Leukocytes, UA: NEGATIVE
Nitrite: NEGATIVE
PROTEIN: NEGATIVE mg/dL
Specific Gravity, Urine: 1.01 (ref 1.005–1.030)
pH: 7 (ref 5.0–8.0)

## 2018-06-02 NOTE — MAU Note (Signed)
Pt states ctx started around 1200am but have gotten stronger since 0400.  Pt deniese LOF or vag bleeding.  Reports good fetal movement

## 2018-06-02 NOTE — MAU Provider Note (Signed)
History     Chief Complaint  Patient presents with  . Contractions   HPI Debra Larsen is a 37 y.o. G3P2002 at 231w3d with c/o ctx for past 2 days, regular and mildly painful for past 3 hours, woke her up from sleep. Denies LOF or VB. Good EFM noted.  Uncomplicated prenatal course to date, [facility] and planning hospital birth.  OB History    Gravida  3   Para  2   Term  2   Preterm      AB      Living  2     SAB      TAB      Ectopic      Multiple  0   Live Births  2           Past Medical History:  Diagnosis Date  . AMA (advanced maternal age) multigravida 35+   . Hx of varicella   . Migraine     Past Surgical History:  Procedure Laterality Date  . WISDOM TOOTH EXTRACTION      Family History  Problem Relation Age of Onset  . Diabetes Maternal Grandmother     Social History   Tobacco Use  . Smoking status: Never Smoker  . Smokeless tobacco: Never Used  Substance Use Topics  . Alcohol use: Not Currently    Comment: Rare  . Drug use: No    Allergies:  Allergies  Allergen Reactions  . Other Other (See Comments)    Pt has a family hx of reactions to vaccines and chooses not to be vaccinated.  Pts mother has severe nerve damage due to vaccines.      Medications Prior to Admission  Medication Sig Dispense Refill Last Dose  . acetaminophen (TYLENOL) 325 MG tablet Take 2 tablets (650 mg total) by mouth every 6 (six) hours as needed (for pain scale < 4). 30 tablet 0   . ibuprofen (ADVIL,MOTRIN) 600 MG tablet Take 1 tablet (600 mg total) by mouth every 6 (six) hours as needed. 30 tablet 0   . Prenatal Vit-Fe Fumarate-FA (PRENATAL MULTIVITAMIN) TABS tablet Take 1 tablet by mouth at bedtime.    07/15/2016 at Unknown time    ROS Physical Exam  As noted above  Physical Exam: Blood pressure 115/73, pulse 100, temperature 97.6 F (36.4 C), temperature source Oral, resp. rate 18, height 5\' 7"  (1.702 m), weight 83.5  kg, SpO2 100 %, unknown if currently breastfeeding. General: NAD Heart: RRR, no murmurs Lungs: CTA b/l  Abd: Soft, NT, EFW 7 lbs  Ext: no edema Neuro: DTRs normal Other:  Dilation: 2 Cervical Position: Posterior Station: -2 Presentation: Vertex Exam by:: Ivonne Andrewaniella Crystalynn Mcinerney CNM   No bloody show or discharge noted FHT 130, mod var, + accels, no decels Ctx mild and rare  ED Course  Procedures  37 y.o. [id number] at 3031w3d  Pre labor FHT cat 1 GBS neg  Active labor instructions given F/U in office as scheduled and PRN DC home now.    Neta Mendsaniela C Ruthella Kirchman, CNM, MSN 06/02/2018, 9:14 AM

## 2018-06-11 ENCOUNTER — Encounter (HOSPITAL_COMMUNITY): Payer: Self-pay

## 2018-06-11 ENCOUNTER — Inpatient Hospital Stay (HOSPITAL_COMMUNITY)
Admission: AD | Admit: 2018-06-11 | Discharge: 2018-06-11 | DRG: 833 | Disposition: A | Discharge status: Home / Self Care | Payer: Self-pay

## 2018-06-11 DIAGNOSIS — Z3A39 39 weeks gestation of pregnancy: Secondary | ICD-10-CM

## 2018-06-11 DIAGNOSIS — O471 False labor at or after 37 completed weeks of gestation: Principal | ICD-10-CM | POA: Diagnosis present

## 2018-06-11 LAB — CBC
HCT: 36.6 % (ref 36.0–46.0)
HEMOGLOBIN: 11.9 g/dL — AB (ref 12.0–15.0)
MCH: 30.4 pg (ref 26.0–34.0)
MCHC: 32.5 g/dL (ref 30.0–36.0)
MCV: 93.4 fL (ref 80.0–100.0)
PLATELETS: 308 10*3/uL (ref 150–400)
RBC: 3.92 MIL/uL (ref 3.87–5.11)
RDW: 14.1 % (ref 11.5–15.5)
WBC: 12.2 10*3/uL — AB (ref 4.0–10.5)
nRBC: 0 % (ref 0.0–0.2)

## 2018-06-11 NOTE — MAU Note (Signed)
Pt here with c/o contractions since about 1730. They're about 5 min apart now. She was checked in the office this afternoon and was 3.5 cm.

## 2018-06-11 NOTE — MAU Note (Signed)
This patient presented to MAU w/complaints of contracting about 5 mins apart. After assessing her she told me that she was a scheduled induction for 7 pm per her CNM. House coverage spoke w/patient and explained that because she was not in labor, we could not guarantee [facility]. This is because there are other active patients on the unit and two scheduled post dates inductions that must be admitted before her. Due to this high census situation, [facility] was no guarantee about when she would be admitted.  She indicated understanding and said she would talk to her husband. She then decided she wanted to go home. We called her [employer], Efrain SellaDaniella, so she could make a decision.  CNM spoke w/patient and the decision was made to discharge patient home and call her when a bed was available.  Patient was discharged with labor instructions.

## 2018-06-11 NOTE — Discharge Instructions (Signed)
Braxton Hicks Contractions °Contractions of the uterus can occur throughout pregnancy, but they are not always a sign that you are in labor. You may have practice contractions called Braxton Hicks contractions. These false labor contractions are sometimes confused with true labor. °What are Braxton Hicks contractions? °Braxton Hicks contractions are tightening movements that occur in the muscles of the uterus before labor. Unlike true labor contractions, these contractions do not result in opening (dilation) and thinning of the cervix. Toward the end of pregnancy (32-34 weeks), Braxton Hicks contractions can happen more often and may become stronger. These contractions are sometimes difficult to tell apart from true labor because they can be very uncomfortable. You should not feel embarrassed if you go to the hospital with false labor. °Sometimes, the only way to tell if you are in true labor is for your health care provider to look for changes in the cervix. The health care provider will do a physical exam and may monitor your contractions. If you are not in true labor, the exam should show that your cervix is not dilating and your water has not broken. °If there are other health problems associated with your pregnancy, it is completely safe for you to be sent home with false labor. You may continue to have Braxton Hicks contractions until you go into true labor. °How to tell the difference between true labor and false labor °True labor °· Contractions last 30-70 seconds. °· Contractions become very regular. °· Discomfort is usually felt in the top of the uterus, and it spreads to the lower abdomen and low back. °· Contractions do not go away with walking. °· Contractions usually become more intense and increase in frequency. °· The cervix dilates and gets thinner. °False labor °· Contractions are usually shorter and not as strong as true labor contractions. °· Contractions are usually irregular. °· Contractions  are often felt in the front of the lower abdomen and in the groin. °· Contractions may go away when you walk around or change positions while lying down. °· Contractions get weaker and are shorter-lasting as time goes on. °· The cervix usually does not dilate or become thin. °Follow these instructions at home: °· Take over-the-counter and prescription medicines only as told by your health care provider. °· Keep up with your usual exercises and follow other instructions from your health care provider. °· Eat and drink lightly if you think you are going into labor. °· If Braxton Hicks contractions are making you uncomfortable: °? Change your position from lying down or resting to walking, or change from walking to resting. °? Sit and rest in a tub of warm water. °? Drink enough fluid to keep your urine pale yellow. Dehydration may cause these contractions. °? Do slow and deep breathing several times an hour. °· Keep all follow-up prenatal visits as told by your health care provider. This is important. °Contact a health care provider if: °· You have a fever. °· You have continuous pain in your abdomen. °Get help right away if: °· Your contractions become stronger, more regular, and closer together. °· You have fluid leaking or gushing from your vagina. °· You pass blood-tinged mucus (bloody show). °· You have bleeding from your vagina. °· You have low back pain that you never had before. °· You feel your baby’s head pushing down and causing pelvic pressure. °· Your baby is not moving inside you as much as it used to. °Summary °· Contractions that occur before labor are called Braxton   Hicks contractions, false labor, or practice contractions. °· Braxton Hicks contractions are usually shorter, weaker, farther apart, and less regular than true labor contractions. True labor contractions usually become progressively stronger and regular and they become more frequent. °· Manage discomfort from Braxton Hicks contractions by  changing position, resting in a warm bath, drinking plenty of water, or practicing deep breathing. °This information is not intended to replace advice given to you by your health care provider. Make sure you discuss any questions you have with your health care provider. °Document Released: 11/16/2016 Document Revised: 11/16/2016 Document Reviewed: 11/16/2016 °Elsevier Interactive Patient Education © 2018 [employer] ° °

## 2018-06-11 NOTE — MAU Provider Note (Signed)
  History     CSN: [id number]  Arrival date and time: 06/11/18 1906   None     Chief Complaint  Patient presents with  . Contractions   HPI  Debra Larsen is a [id number] at 4764w5d here for planned IOL/augmentation of latent labor for past 2 weeks, seen in clinic this afternoon and  Was 3-4/70/-1, IBOW. Patient had membrane sweep as per request and [facility] Patient presented earlier than scheduled for labor check due to increasing painful contractions. [facility] due to high unit census and has been waiting in in MAU for past 3 hours.   [facility], [facility] hospital birth, pregnancy course complicated by prodromal bouts of labor since 37 weeks.  Past Medical History:  Diagnosis Date  . AMA (advanced maternal age) multigravida 35+   . Hx of varicella   . Migraine     Past Surgical History:  Procedure Laterality Date  . WISDOM TOOTH EXTRACTION      Family History  Problem Relation Age of Onset  . Diabetes Maternal Grandmother     Social History   Tobacco Use  . Smoking status: Never Smoker  . Smokeless tobacco: Never Used  Substance Use Topics  . Alcohol use: Not Currently    Comment: Rare  . Drug use: No    Allergies:  Allergies  Allergen Reactions  . Other Other (See Comments)    Pt has a family hx of reactions to vaccines and chooses not to be vaccinated.  Pts mother has severe nerve damage due to vaccines.      Medications Prior to Admission  Medication Sig Dispense Refill Last Dose  . Prenatal Vit-Fe Fumarate-FA (PRENATAL MULTIVITAMIN) TABS tablet Take 1 tablet by mouth at bedtime.    06/10/2018 at Unknown time  . acetaminophen (TYLENOL) 325 MG tablet Take 2 tablets (650 mg total) by mouth every 6 (six) hours as needed (for pain scale < 4). 30 tablet 0     Review of Systems Physical Exam   Blood pressure 112/63, pulse 92, temperature 98.2 F (36.8 C), temperature source Oral,  resp. rate 18, height 5\' 7"  (1.702 m), weight 83.5 kg, SpO2 100 %, unknown if currently breastfeeding.  Physical Exam   Gen: AAO x 3, NAD, contractions q 10 min FHT 120, mod var, + accels, no decels  VE deferred, ctx 10 min apart and mild  MAU Course  Procedures NST - reactive   Assessment and Plan  G3P2002 at 1864w5d  Prodromal labor FHT category 1  High census, will postpone IOL until bed available in L&D Patient desires to go home and await either open bed or active labor. Labor precautions given IOL orders signed and held  [facility] anytime there is available induction spot in next 24 hours.  Dr. Billy Coastaavon in consult  Neta Mendsaniela C Female Minish, MSN, CNM 06/11/2018, 10:09 PM

## 2018-06-12 ENCOUNTER — Encounter (HOSPITAL_COMMUNITY): Payer: Self-pay | Admitting: *Deleted

## 2018-06-12 ENCOUNTER — Inpatient Hospital Stay (HOSPITAL_COMMUNITY)
Admission: AD | Admit: 2018-06-12 | Discharge: 2018-06-13 | DRG: 807 | Disposition: A | Discharge status: Home / Self Care | Payer: Self-pay

## 2018-06-12 ENCOUNTER — Other Ambulatory Visit: Payer: Self-pay

## 2018-06-12 DIAGNOSIS — Z3A39 39 weeks gestation of pregnancy: Secondary | ICD-10-CM

## 2018-06-12 LAB — TYPE AND SCREEN
ABO/RH(D): O POS
ABO/RH(D): O POS
ANTIBODY SCREEN: NEGATIVE
ANTIBODY SCREEN: NEGATIVE

## 2018-06-12 LAB — RPR: RPR Ser Ql: NONREACTIVE

## 2018-06-12 MED ORDER — MISOPROSTOL 200 MCG PO TABS
ORAL_TABLET | ORAL | Status: AC
  Filled 2018-06-12: qty 2

## 2018-06-12 MED ORDER — OXYTOCIN 10 UNIT/ML IJ SOLN
10.0000 [IU] | Freq: Once | INTRAMUSCULAR | Status: DC
  Filled 2018-06-12: qty 1

## 2018-06-12 MED ORDER — ONDANSETRON HCL 4 MG/2ML IJ SOLN: 4.0000 mg | Freq: Four times a day (QID) | INTRAMUSCULAR | Status: DC | PRN

## 2018-06-12 MED ORDER — MISOPROSTOL 50MCG HALF TABLET
50.0000 ug | ORAL_TABLET | ORAL | Status: DC
  Administered 2018-06-12 (×2): 50 ug via ORAL
  Filled 2018-06-12 (×6): qty 1

## 2018-06-12 MED ORDER — SOD CITRATE-CITRIC ACID 500-334 MG/5ML PO SOLN: 30.0000 mL | ORAL | Status: DC | PRN

## 2018-06-12 MED ORDER — OXYCODONE-ACETAMINOPHEN 5-325 MG PO TABS: 2.0000 | ORAL_TABLET | ORAL | Status: DC | PRN

## 2018-06-12 MED ORDER — LIDOCAINE HCL (PF) 1 % IJ SOLN
30.0000 mL | INTRAMUSCULAR | Status: DC | PRN
  Administered 2018-06-12: 30 mL via SUBCUTANEOUS
  Filled 2018-06-12: qty 30

## 2018-06-12 MED ORDER — OXYCODONE-ACETAMINOPHEN 5-325 MG PO TABS: 1.0000 | ORAL_TABLET | ORAL | Status: DC | PRN

## 2018-06-12 MED ORDER — OXYTOCIN BOLUS FROM INFUSION: 500.0000 mL | Freq: Once | INTRAVENOUS | Status: DC

## 2018-06-12 MED ORDER — LACTATED RINGERS IV SOLN: 500.0000 mL | INTRAVENOUS | Status: DC | PRN

## 2018-06-12 MED ORDER — MISOPROSTOL 200 MCG PO TABS
400.0000 ug | ORAL_TABLET | Freq: Once | ORAL | Status: AC
  Administered 2018-06-12: 400 ug via ORAL

## 2018-06-12 MED ORDER — OXYTOCIN 40 UNITS IN LACTATED RINGERS INFUSION - SIMPLE MED: 2.5000 [IU]/h | INTRAVENOUS | Status: DC

## 2018-06-12 MED ORDER — TERBUTALINE SULFATE 1 MG/ML IJ SOLN
0.2500 mg | Freq: Once | INTRAMUSCULAR | Status: DC | PRN
  Filled 2018-06-12: qty 1

## 2018-06-12 MED ORDER — ACETAMINOPHEN 325 MG PO TABS: 650.0000 mg | ORAL_TABLET | ORAL | Status: DC | PRN

## 2018-06-12 MED ORDER — LACTATED RINGERS IV SOLN: INTRAVENOUS | Status: DC

## 2018-06-12 NOTE — Progress Notes (Signed)
Pt. Verbalized to RN that she is agreeable to having her type and screen drawn at this time. Phlebotomy was notified and will draw at this time.

## 2018-06-12 NOTE — Progress Notes (Signed)
Pt refusing to have her blood redrawn for a new type and screen.  RN explained the reason this lab was needed and the risks of not having it done prior to an emergency situation.  Pt. States to RN and phlebotomist that she will only have blood drawn if an emergency arises.  Colon Flattery. Paul, CNM notified and will discuss with pt. When she comes back to evaluate.

## 2018-06-12 NOTE — Progress Notes (Signed)
S: Sitting on birthing ball and comfortable, no LOF or VB. S/P oral cytotec x 1 dose, due again.  No N/V, ctx feeling a bit stronger and more frequent.  Plans hydrotherapy in active labor, has doula support. Spouse and mother in room and supportive.   O: Vitals:   06/12/18 1424 06/12/18 1530 06/12/18 1632 06/12/18 1734  BP: 116/66 110/64 (!) 108/55 118/60  Pulse: 89 96 86 89  Resp: 18 16 18 16   Temp:  98.2 F (36.8 C)    TempSrc:  Oral    Weight:      Height:         FHT:  FHR: 130 bpm, variability: moderate,  accelerations:  Present,  decelerations:  Absent UC:   regular, every 5 minutes, mild SVE:   Dilation: 4.5 Effacement (%): 70 Station: -1 Exam by:: Colon Flattery. Paul, CNM Posterior, vertex, BBOW  A / P: Induction of labor due to prodromal labor,  progressing well on oral cytotec Repeat dose now, will AROM if no active labor in next 2 hrs  Fetal Wellbeing:  Category I Pain Control:  Labor support without medications and hydrotherapy GBS neg Anticipated MOD:  NSVD  Neta Mendsaniela C Paul, CNM, MSN 06/12/2018, 6:03 PM

## 2018-06-12 NOTE — H&P (Signed)
OB ADMISSION/ HISTORY & PHYSICAL:  Admission Date: 06/12/2018 11:03 AM  Admit Diagnosis: elective labor induction  Debra Larsen is a 37 y.o. female presenting for labor induction at term with favorable bishop score.  Prenatal History: [id number]   EDC : 06/13/2018, Date entered prior to episode creation  Prenatal care [facility] Prenatal course: AMA / hx SVD x 2  Prenatal Labs: ABO, Rh: --/--/O POS (11/26 2003) Antibody: NEG (11/26 2003) Rubella: Immune (05/31 0000)  RPR: Non Reactive (11/26 2003)  HBsAg: Negative (05/31 0000)  HIV:   NR GTT: passed GBS: Negative (11/05 0000)   OB History  Gravida Para Term Preterm AB Living  3 2 2     2   SAB TAB Ectopic Multiple Live Births        0 2    # Outcome Date GA Lbr Len/2nd Weight Sex Delivery Anes PTL Lv  3 Current           2 Term 07/16/16 1726w6d 07:42 / 00:03 3600 g M Vag-Spont EPI  LIV  1 Term 11/21/14 9733w4d 02:20 / 01:25 3980 g M Vag-Spont EPI  LIV    Medical / Surgical History :  Past medical history:  Past Medical History:  Diagnosis Date  . AMA (advanced maternal age) multigravida 35+   . Hx of varicella   . Migraine      Past surgical history:  Past Surgical History:  Procedure Laterality Date  . WISDOM TOOTH EXTRACTION      Family History:  Family History  Problem Relation Age of Onset  . Diabetes Maternal Grandmother      Social History:  reports that she has never smoked. She has never used smokeless tobacco. She reports that she drank alcohol. She reports that she does not use drugs.  Allergies: Other    Current Medications at time of admission:  Prior to Admission medications   Medication Sig Start Date End Date Taking? Authorizing Provider  acetaminophen (TYLENOL) 325 MG tablet Take 2 tablets (650 mg total) by mouth every 6 (six) hours as needed (for pain scale < 4). 07/17/16   Harold Hedgeomblin, James, MD  Prenatal Vit-Fe Fumarate-FA (PRENATAL MULTIVITAMIN) TABS tablet Take 1 tablet by  mouth at bedtime.     [provider]   Review of Systems: Active FM Mild irregular ctx  Physical Exam: VS: Blood pressure 116/68, pulse 84, temperature 98.6 F (37 C), temperature source Oral, resp. rate 16, height 5\' 7"  (1.702 m), weight 83.5 kg, unknown if currently breastfeeding.  General: alert and oriented, appears calm and comfortable Abdomen: Gravid, soft and non-tender, non-distended / uterus: gravid Extremities: no edema  Genitalia / VE: Dilation: 4 Effacement (%): 50 Station: -2 Exam by:: Debra Junes. Goodman, RN  FHR: baseline rate 130 / variability moderate / accelerations + / no decelerations  Assessment: 39.[redacted] weeks gestation Induction of labor FHR category 1  Plan:  Admit - cytotec to initiate ctx AROM augmentation  Arlan Organaniela Larsen CNM to follow today in labor   Debra Larsen CNM, MSN, [facility] 06/12/2018, 12:20 PM

## 2018-06-12 NOTE — Anesthesia Pain Management Evaluation Note (Signed)
  CRNA Pain Management Visit Note  Patient: Debra Larsen, 37 y.o., female  "Hello I am a [facility] of the [facility]. We have an anesthesia team available at all times to provide care throughout the hospital, including epidural management and anesthesia for C-section. I don't know your plan for the delivery whether it a natural birth, water birth, IV sedation, nitrous supplementation, doula or epidural, but we want to meet your pain goals."   1.Was your pain managed to your expectations on prior hospitalizations?   Yes   2.What is your expectation for pain management during this hospitalization?     Labor support without medications  3.How can we help you reach that goal?   Record the patient's initial score and the patient's pain goal.   Pain: 4  Pain Goal: 4 [facility] wants you to be able to say your pain was always managed very well.  Laban EmperorMalinova,Analuisa Tudor Hristova 06/12/2018

## 2018-06-13 LAB — CBC
HEMATOCRIT: 34.1 % — AB (ref 36.0–46.0)
HEMOGLOBIN: 11.1 g/dL — AB (ref 12.0–15.0)
MCH: 30.3 pg (ref 26.0–34.0)
MCHC: 32.6 g/dL (ref 30.0–36.0)
MCV: 93.2 fL (ref 80.0–100.0)
Platelets: 291 10*3/uL (ref 150–400)
RBC: 3.66 MIL/uL — ABNORMAL LOW (ref 3.87–5.11)
RDW: 14.1 % (ref 11.5–15.5)
WBC: 17.1 10*3/uL — ABNORMAL HIGH (ref 4.0–10.5)
nRBC: 0 % (ref 0.0–0.2)

## 2018-06-13 MED ORDER — DIBUCAINE 1 % RE OINT: 1.0000 "application " | TOPICAL_OINTMENT | RECTAL | Status: DC | PRN

## 2018-06-13 MED ORDER — OXYCODONE HCL 5 MG PO TABS: 5.0000 mg | ORAL_TABLET | ORAL | Status: DC | PRN

## 2018-06-13 MED ORDER — FLEET ENEMA 7-19 GM/118ML RE ENEM: 1.0000 | ENEMA | Freq: Every day | RECTAL | Status: DC | PRN

## 2018-06-13 MED ORDER — COCONUT OIL OIL: 1.0000 "application " | TOPICAL_OIL | Status: DC | PRN

## 2018-06-13 MED ORDER — BENZOCAINE-MENTHOL 20-0.5 % EX AERO: 1.0000 "application " | INHALATION_SPRAY | CUTANEOUS | Status: DC | PRN

## 2018-06-13 MED ORDER — PRENATAL MULTIVITAMIN CH
1.0000 | ORAL_TABLET | Freq: Every day | ORAL | Status: DC
  Filled 2018-06-13: qty 1

## 2018-06-13 MED ORDER — COCONUT OIL OIL: 1.0000 "application " | TOPICAL_OIL | 0 refills | Status: DC | PRN

## 2018-06-13 MED ORDER — DIPHENHYDRAMINE HCL 25 MG PO CAPS: 25.0000 mg | ORAL_CAPSULE | Freq: Four times a day (QID) | ORAL | Status: DC | PRN

## 2018-06-13 MED ORDER — IBUPROFEN 600 MG PO TABS: 600.0000 mg | ORAL_TABLET | Freq: Four times a day (QID) | ORAL | 0 refills | Status: DC

## 2018-06-13 MED ORDER — SIMETHICONE 80 MG PO CHEW: 80.0000 mg | CHEWABLE_TABLET | ORAL | Status: DC | PRN

## 2018-06-13 MED ORDER — ONDANSETRON HCL 4 MG/2ML IJ SOLN: 4.0000 mg | INTRAMUSCULAR | Status: DC | PRN

## 2018-06-13 MED ORDER — ACETAMINOPHEN 325 MG PO TABS: 650.0000 mg | ORAL_TABLET | ORAL | Status: DC | PRN

## 2018-06-13 MED ORDER — ONDANSETRON HCL 4 MG PO TABS: 4.0000 mg | ORAL_TABLET | ORAL | Status: DC | PRN

## 2018-06-13 MED ORDER — WITCH HAZEL-GLYCERIN EX PADS: 1.0000 "application " | MEDICATED_PAD | CUTANEOUS | Status: DC | PRN

## 2018-06-13 MED ORDER — IBUPROFEN 600 MG PO TABS
600.0000 mg | ORAL_TABLET | Freq: Four times a day (QID) | ORAL | Status: DC
  Administered 2018-06-13 (×2): 600 mg via ORAL
  Filled 2018-06-13 (×2): qty 1

## 2018-06-13 MED ORDER — BISACODYL 10 MG RE SUPP: 10.0000 mg | Freq: Every day | RECTAL | Status: DC | PRN

## 2018-06-13 MED ORDER — TETANUS-DIPHTH-ACELL PERTUSSIS 5-2.5-18.5 LF-MCG/0.5 IM SUSP: 0.5000 mL | Freq: Once | INTRAMUSCULAR | Status: DC

## 2018-06-13 MED ORDER — DOCUSATE SODIUM 100 MG PO CAPS
100.0000 mg | ORAL_CAPSULE | Freq: Two times a day (BID) | ORAL | Status: DC
  Administered 2018-06-13: 100 mg via ORAL
  Filled 2018-06-13 (×2): qty 1

## 2018-06-13 NOTE — Progress Notes (Signed)
PPD # 1 S/P NSVD  Live born female  Birth Weight: 7 lb 3.6 oz (3277 g) APGAR: 9, 9  Newborn Delivery   Birth date/time:  06/12/2018 21:01:00 Delivery type:  Vaginal, Spontaneous    Baby name: [name] ClientHannah Delivering provider: Arlan OrganPAUL,  C    Feeding: breast  Pain control at delivery: Local   S:  Reports feeling well, desires DC home today             Tolerating po/ No nausea or vomiting             Bleeding is light             Pain controlled with ibuprofen (OTC)             Up ad lib / ambulatory / voiding without difficulties   O:  A & O x 3, in no apparent distress              VS:  Vitals:   06/12/18 2330 06/13/18 0040 06/13/18 0442 06/13/18 0918  BP: 102/73 119/77 106/69 110/70  Pulse: 72 88 67 71  Resp: 18 18 16 18   Temp: 98.1 F (36.7 C) 98.7 F (37.1 C) 97.9 F (36.6 C) 98 F (36.7 C)  TempSrc: Oral Oral Oral Oral  SpO2: 99% 99% 97%   Weight:      Height:        LABS:  Recent Labs    06/11/18 2003 06/13/18 0602  WBC 12.2* 17.1*  HGB 11.9* 11.1*  HCT 36.6 34.1*  PLT 308 291    Blood type: --/--/O POS (11/27 1910)  Rubella: Immune (05/31 0000)   I&O: I/O last 3 completed shifts: In: -  Out: 100 [Blood:100]          No intake/output data recorded.  Vaccines: TDaP declined         Flu    declined   Lungs: Clear and unlabored  Heart: regular rate and rhythm / no murmurs  Abdomen: soft, non-tender, non-distended             Fundus: firm, non-tender, U-1  Perineum: repair intact  Lochia: small  Extremities: no edema, no calf pain or tenderness    A/P: PPD # 1 37 y.o., [id number]   Principal Problem:   SVD 11/27 Active Problems:   Postpartum care following vaginal delivery   First degree perineal laceration during delivery   Doing well - stable status  Routine post partum orders  DC home today, patient aware may be unable to go home tonight, late delivery time and baby needs 24 hours assessment.   F/U at 2 weeks postpartum in  office    Neta Mendsaniela C , MSN, CNM 06/13/2018, 11:43 AM

## 2018-06-13 NOTE — Discharge Summary (Signed)
OB Discharge Summary  Patient Name: Debra Larsen DOB: Mar 15, 1981 MRN: [id number]  Date of admission: 06/12/2018 Delivering provider: Neta Mends   Date of discharge: 06/13/2018  Admitting diagnosis: 39WKS INDUCTION  Intrauterine pregnancy: [redacted]w[redacted]d     Secondary diagnosis:Principal Problem:   SVD 11/27 Active Problems:   Postpartum care following vaginal delivery   First degree perineal laceration during delivery  Additional problems:none     Discharge diagnosis:  Patient Active Problem List   Diagnosis Date Noted  . Postpartum care following vaginal delivery 06/12/2018  . First degree perineal laceration during delivery 06/12/2018  . Prolonged latent phase of labor 06/11/2018  . SVD 11/27 07/16/2016                                                                Post partum procedures:none  Augmentation: AROM and Cytotec Pain control: Local  Laceration:1st degree  Episiotomy:None  Complications: None  Hospital course:  Induction of Labor With Vaginal Delivery   37 y.o. yo G3P3003 at [redacted]w[redacted]d was admitted to the hospital 06/12/2018 for induction of labor.  Indication for induction: Favorable cervix at term and prodromal labor.  Patient had an uncomplicated labor course as follows: Membrane Rupture Time/Date: 8:15 PM ,06/12/2018   Intrapartum Procedures: Episiotomy: None [1]                                         Lacerations:  1st degree [2]  Patient had delivery of a Viable infant.  Information for the patient's newborn:  LASHAWN, BROMWELL [[id number]]  Delivery Method: Vaginal, Spontaneous(Filed from Delivery Summary)   06/12/2018  Details of delivery can be found in separate delivery note.  Patient had a routine postpartum course. Patient is discharged home 06/13/18.  Physical exam  Vitals:   06/13/18 0040 06/13/18 0442 06/13/18 0918 06/13/18 1448  BP: 119/77 106/69 110/70 119/71  Pulse: 88 67 71 77  Resp: 18 16 18 18   Temp: 98.7 F (37.1 C) 97.9 F  (36.6 C) 98 F (36.7 C) 98.8 F (37.1 C)  TempSrc: Oral Oral Oral Oral  SpO2: 99% 97%    Weight:      Height:       General: alert, cooperative and no distress Lochia: appropriate Uterine Fundus: firm Incision: Healing well with no significant drainage DVT Evaluation: No cords or calf tenderness. No significant calf/ankle edema. Labs: Lab Results  Component Value Date   WBC 17.1 (H) 06/13/2018   HGB 11.1 (L) 06/13/2018   HCT 34.1 (L) 06/13/2018   MCV 93.2 06/13/2018   PLT 291 06/13/2018   CMP Latest Ref Rng & Units 02/27/2013  Glucose 70 - 99 mg/dL 76  BUN 6 - 23 mg/dL 10  Creatinine 7.82 - 9.56 mg/dL 2.13  Sodium 086 - 578 mEq/L 138  Potassium 3.5 - 5.3 mEq/L 4.1  Chloride 96 - 112 mEq/L 102  CO2 19 - 32 mEq/L 28  Calcium 8.4 - 10.5 mg/dL 9.2  Total Protein 6.0 - 8.3 g/dL 7.0  Total Bilirubin 0.3 - 1.2 mg/dL 0.7  Alkaline Phos 39 - 117 U/L 49  AST 0 - 37 U/L 13  ALT 0 - 35 U/L  8    Vaccines: TDaP declined         Flu    declined  Discharge instruction: per After Visit Summary and "Baby and Me Booklet".  After Visit Meds:  Allergies as of 06/13/2018      Reactions   Other Other (See Comments)   Pt has a family hx of reactions to vaccines and chooses not to be vaccinated.  Pts mother has severe nerve damage due to vaccines.        Medication List    TAKE these medications   acetaminophen 325 MG tablet Commonly known as:  TYLENOL Take 2 tablets (650 mg total) by mouth every 4 (four) hours as needed (for pain scale < 4).   benzocaine-Menthol 20-0.5 % Aero Commonly known as:  DERMOPLAST Apply 1 application topically as needed for irritation (perineal discomfort).   coconut oil Oil Apply 1 application topically as needed.   ibuprofen 600 MG tablet Commonly known as:  ADVIL,MOTRIN Take 1 tablet (600 mg total) by mouth every 6 (six) hours.            Discharge Care Instructions  (From admission, onward)         Start     Ordered   06/13/18 0000   Discharge wound care:    Comments:  Sitz baths 2 times /day with warm water x 1 week   06/13/18 1503          Diet: routine diet  Activity: Advance as tolerated. Pelvic rest for 6 weeks.   Postpartum contraception: Not Discussed  Newborn Data: Live born female  Birth Weight: 7 lb 3.6 oz (3277 g) APGAR: 9, 9  Newborn Delivery   Birth date/time:  06/12/2018 21:01:00 Delivery type:  Vaginal, Spontaneous     named Armond HangHannah Grace Baby Feeding: Breast Disposition:rooming in   Delivery Report:  Review the Delivery Report for details.    Follow up: Follow-up Information    Neta Mendsaul, Daniela C, CNM. Schedule an appointment as soon as possible for a visit in 2 week(s).   Specialty:  Obstetrics and Gynecology Why:  Postpartum visit Contact information: [address] [place] [address] [phone]             Signed: Cipriano MileDaniela C Paul, CNM, MSN 06/13/2018, 3:04 PM

## 2018-06-13 NOTE — Lactation Note (Signed)
This note was copied from a baby's chart. Lactation Consultation Note  Patient Name: Girl Marca AnconaLauren Dadamo ZOXWR'UToday's Date: 06/13/2018 Reason for consult: Initial assessment  Visited with P3 experienced breastfeeding Mom of term baby at 6213 hrs old.  Baby has been alert, and latching and breastfeeding well.  Mom declines any difficulty or need for assistance. Recommended STS and feeding baby with cues.  Goal of 8-12 feedings per 24 hrs.  Lactation brochure left with Mom.  Mom aware of IP and OP lactation support available to her. Encouraged to call prn  Consult Status Consult Status: Complete Date: 06/13/18 Follow-up type: Call as needed    Judee ClaraSmith, Kielee Care E 06/13/2018, 10:09 AM

## 2018-06-13 NOTE — Lactation Note (Signed)
This note was copied from a baby's chart. Lactation Consultation Note  Patient Name: Debra Larsen ZOXWR'UToday's Date: 06/13/2018 Reason for consult: Follow-up assessment  Mom requested Lactation Consultant to assess baby's mouth for a "tongue-tie".  Mom states that a friend had a horrible time with her baby's latch.  Mom states at the last latch, she felt a little pain and wanted to be sure.   Talked to Mom about normal functional tongue movement and ability to transfer milk from the breast.  Explained that if baby wasn't transferring milk, having less output, nipple trauma, and excessive weight loss, then we would look at oral structures.   Mom still wanted LC to look at baby's mouth.  Tongue noted to elevate and extend well.  No visible anterior frenulum noted.  Mom asked me to look under baby's tongue.  Reassured her that her Pediatrician would address this.  Baby latched in cradle hold.  Suggested she use cross cradle hold, using pillow for support.  Mom still using cradle.  Baby's latch looks fairly good.  Mom pulling breast away from baby's nose.  Talked about angling in baby's chin to be in closer to give baby's nose space.  Mom appreciative of guidance.    Mom to let her nurse know if nipples are becoming sore, or trauma noted on them.  Offered coconut oil, but Mom declined, saying it wasn't bad.   Consult Status Consult Status: Follow-up Date: 06/14/18 Follow-up type: In-patient    Judee ClaraSmith, [name] 06/13/2018, 3:20 PM

## 2020-04-26 ENCOUNTER — Other Ambulatory Visit: Payer: Self-pay | Admitting: Gastroenterology

## 2020-04-26 DIAGNOSIS — R1011 Right upper quadrant pain: Secondary | ICD-10-CM

## 2020-05-04 ENCOUNTER — Ambulatory Visit
Admission: RE | Admit: 2020-05-04 | Discharge: 2020-05-04 | Disposition: A | Discharge status: Home / Self Care | Payer: No Typology Code available for payment source | Source: Ambulatory Visit | Attending: Gastroenterology | Admitting: Gastroenterology

## 2020-05-04 DIAGNOSIS — R1011 Right upper quadrant pain: Secondary | ICD-10-CM

## 2020-05-05 ENCOUNTER — Other Ambulatory Visit: Payer: Self-pay | Admitting: Gastroenterology

## 2020-05-05 DIAGNOSIS — R9389 Abnormal findings on diagnostic imaging of other specified body structures: Secondary | ICD-10-CM

## 2020-05-05 DIAGNOSIS — K769 Liver disease, unspecified: Secondary | ICD-10-CM

## 2020-05-14 ENCOUNTER — Ambulatory Visit (HOSPITAL_COMMUNITY): Admission: RE | Admit: 2020-05-14 | Payer: No Typology Code available for payment source | Source: Ambulatory Visit

## 2020-05-22 ENCOUNTER — Ambulatory Visit (HOSPITAL_COMMUNITY)
Admission: RE | Admit: 2020-05-22 | Discharge: 2020-05-22 | Disposition: A | Discharge status: Home / Self Care | Payer: Self-pay | Source: Ambulatory Visit | Attending: Gastroenterology | Admitting: Gastroenterology

## 2020-05-22 ENCOUNTER — Other Ambulatory Visit: Payer: Self-pay

## 2020-05-22 DIAGNOSIS — K769 Liver disease, unspecified: Secondary | ICD-10-CM | POA: Insufficient documentation

## 2020-05-22 DIAGNOSIS — R9389 Abnormal findings on diagnostic imaging of other specified body structures: Secondary | ICD-10-CM | POA: Insufficient documentation

## 2020-05-22 MED ORDER — GADOBUTROL 1 MMOL/ML IV SOLN
7.0000 mL | Freq: Once | INTRAVENOUS | Status: AC | PRN
  Administered 2020-05-22: 7 mL via INTRAVENOUS

## 2020-05-26 ENCOUNTER — Other Ambulatory Visit: Payer: No Typology Code available for payment source

## 2021-07-17 NOTE — L&D Delivery Note (Signed)
OB/GYN Faculty Practice Delivery Note  Debra Larsen is a 41 y.o. [phone] s/p vag del at [redacted]w[redacted]d. She was admitted for questionable leaking with reg ctx.   ROM: 3h 63m with clear fluid GBS Status: neg Maximum Maternal Temperature: 98.1  Labor Progress: Ms Debra Larsen came to MAU w pink/bloody d/c and possible leaking (-fern, +pool). She was admitted for AROM to augment her ctx; she got into the tub soon after and then progressed to SVB 3hrs afterwards.  Delivery Date/Time: November 24th, 2023 at 2110 Delivery: Called to room and patient was complete and pushing. Head delivered OA. No nuchal cord present. Shoulder and body delivered in usual fashion. Infant with spontaneous cry, placed on mother's abdomen, dried and stimulated. Cord clamped x 2 after at least a 1-minute delay, and cut by FOB. She was then assisted out of the tub and to the bed. Cord blood drawn. Placenta delivered spontaneously with gentle cord traction. Fundus firm with massage and Pitocin. Labia, perineum, vagina, and cervix inspected and found to be intact. Pitocin 51mu IM given.   Placenta: spont, intact Complications: none Lacerations: none EBL: 273cc Analgesia: none  Postpartum Planning [x]  message to sent to schedule follow-up   Infant: girl  APGARs 8/9  3680g (8lb 1.8oz)  10/9, CNM  06/09/2022 9:41 PM

## 2021-10-06 ENCOUNTER — Telehealth: Payer: Self-pay | Admitting: *Deleted

## 2021-10-06 NOTE — Telephone Encounter (Signed)
Left patient an urgent message to call and reschedule New OB appointment on 11/04/2021 until the week of 11/07/2021, no available appointments on 11/03/2021 for a New OB. ?

## 2021-10-12 ENCOUNTER — Telehealth: Payer: Self-pay | Admitting: *Deleted

## 2021-10-12 NOTE — Telephone Encounter (Signed)
Left patient an urgent message to call and reschedule New OB appointment on 11/04/2021 until the week of 11/07/2021, no available appointments on 11/03/2021 for a New OB. ?

## 2021-10-27 ENCOUNTER — Encounter: Payer: Self-pay | Admitting: *Deleted

## 2021-10-31 ENCOUNTER — Telehealth: Payer: Self-pay | Admitting: *Deleted

## 2021-10-31 NOTE — Telephone Encounter (Signed)
Left patient an urgent message with updated appointment information and to call the office to discuss self pay payments. ?

## 2021-11-04 ENCOUNTER — Ambulatory Visit (INDEPENDENT_AMBULATORY_CARE_PROVIDER_SITE_OTHER): Payer: Self-pay | Admitting: Certified Nurse Midwife

## 2021-11-04 ENCOUNTER — Encounter: Payer: Self-pay | Admitting: Certified Nurse Midwife

## 2021-11-04 VITALS — BP 104/67 | HR 68 | Wt 139.0 lb

## 2021-11-04 DIAGNOSIS — Z3490 Encounter for supervision of normal pregnancy, unspecified, unspecified trimester: Secondary | ICD-10-CM | POA: Insufficient documentation

## 2021-11-04 DIAGNOSIS — O09521 Supervision of elderly multigravida, first trimester: Secondary | ICD-10-CM

## 2021-11-04 DIAGNOSIS — Z3A10 10 weeks gestation of pregnancy: Secondary | ICD-10-CM

## 2021-11-04 DIAGNOSIS — Z3491 Encounter for supervision of normal pregnancy, unspecified, first trimester: Secondary | ICD-10-CM

## 2021-11-04 NOTE — Progress Notes (Signed)
Last pap 10/22 @ [facility] ?Nipts declined ?STD testing declined ?

## 2021-11-04 NOTE — Progress Notes (Signed)
Bedside U/S shows single active IUP with FHT of 163 BPM  CRL measures 37.16mm  GA [redacted]w[redacted]d ?

## 2021-11-06 DIAGNOSIS — O09529 Supervision of elderly multigravida, unspecified trimester: Secondary | ICD-10-CM | POA: Insufficient documentation

## 2021-11-06 LAB — CULTURE, OB URINE

## 2021-11-06 LAB — URINE CULTURE, OB REFLEX

## 2021-11-06 NOTE — Progress Notes (Signed)
?  ? ?Subjective:  ? ?Debra Larsen is a 41 y.o. [phone] at [redacted]w[redacted]d by LMP, early ultrasound being seen today for her first obstetrical visit.  Her obstetrical history is significant for advanced maternal age. Patient does intend to breast feed. Pregnancy history fully reviewed. ? ?Patient reports no complaints. ? ?HISTORY: ?OB History  ?Gravida Para Term Preterm AB Living  ?4 3 3  0 0 3  ?SAB IAB Ectopic Multiple Live Births  ?0 0 0 0 3  ?  ?# Outcome Date GA Lbr Len/2nd Weight Sex Delivery Anes PTL Lv  ?4 Current           ?3 Term 06/12/18 [redacted]w[redacted]d / 00:02 7 lb 3.6 oz (3.277 kg) F Vag-Spont Local  LIV  ?   Name: OKEMA, ROLLINSON  ?   Apgar1: 9  Apgar5: 9  ?2 Term 07/16/16 [redacted]w[redacted]d 07:42 / 00:03 7 lb 15 oz (3.6 kg) M Vag-Spont EPI  LIV  ?   Name: PAETYN, PIETRZAK  ?   Apgar1: 9  Apgar5: 9  ?1 Term 11/21/14 [redacted]w[redacted]d 02:20 / 01:25 8 lb 12.4 oz (3.98 kg) M Vag-Spont EPI  LIV  ?   Apgar1: 9  Apgar5: 9  ? ?Past Medical History:  ?Diagnosis Date  ? AMA (advanced maternal age) multigravida 35+   ? Hx of varicella   ? Migraine   ? ?Past Surgical History:  ?Procedure Laterality Date  ? WISDOM TOOTH EXTRACTION    ? ?Family History  ?Problem Relation Age of Onset  ? Diabetes Maternal Grandmother   ? ?Social History  ? ?Tobacco Use  ? Smoking status: Never  ? Smokeless tobacco: Never  ?Vaping Use  ? Vaping Use: Never used  ?Substance Use Topics  ? Alcohol use: Not Currently  ?  Comment: Rare  ? Drug use: No  ? ?Allergies  ?Allergen Reactions  ? Other Other (See Comments)  ?  Pt has a family hx of reactions to vaccines and chooses not to be vaccinated.  Pts mother has severe nerve damage due to vaccines.    ? ?Current Outpatient Medications on File Prior to Visit  ?Medication Sig Dispense Refill  ? Ascorbic Acid (VITAMIN C) 100 MG tablet Take by mouth.    ? B Complex Vitamins (VITAMIN B COMPLEX PO) vitamin B complex    ? Cholecalciferol 50 MCG (2000 UT) TABS Take by mouth.    ? Omega-3 Fatty Acids (FISH OIL) 1000 MG CAPS  Fish Oil    ? Prenatal Vit-Fe Fumarate-FA (PRENATAL VITAMINS PO) Take by mouth.    ? PROBIOTIC PRODUCT PO Take by mouth.    ? ?No current facility-administered medications on file prior to visit.  ? ? ?Indications for ASA therapy (per uptodate) ?One of the following: ?Previous pregnancy with preeclampsia, especially early onset and with an adverse outcome No ?Multifetal gestation No ?Chronic hypertension No ?Type 1 or 2 diabetes mellitus No ?Chronic kidney disease No ?Autoimmune disease (antiphospholipid syndrome, systemic lupus erythematosus) No ? ?Two or more of the following: ?Nulliparity No ?Obesity (body mass index >30 kg/m2) No ?Family history of preeclampsia in mother or sister No ?Age ?35 years Yes ?Sociodemographic characteristics (African American race, low socioeconomic level) No ?Personal risk factors (eg, previous pregnancy with low birth weight or small for gestational age infant, previous adverse pregnancy outcome [eg, stillbirth], interval >10 years between pregnancies) No ? ?Indications for early 1 hour GTT (per uptodate) ? ?BMI >25 (>23 in Asian women) AND one of the following ? ?  Gestational diabetes mellitus in a previous pregnancy No ?Glycated hemoglobin ?5.7 percent (39 mmol/mol), impaired glucose tolerance, or impaired fasting glucose on previous testing No ?First-degree relative with diabetes No ?High-risk race/ethnicity (eg, African American, Latino, Native American, [place] American, Pacific Islander) No ?History of cardiovascular disease No ?Hypertension or on therapy for hypertension No ?High-density lipoprotein cholesterol level <35 mg/dL (4.17 mmol/L) and/or a triglyceride level >250 mg/dL (4.08 mmol/L) No ?Polycystic ovary syndrome No ?Physical inactivity No ?Other clinical condition associated with insulin resistance (eg, severe obesity, acanthosis nigricans) No ?Previous birth of an infant weighing ?4000 g No ?Previous stillbirth of unknown cause No ? ? ?Exam  ? ?Vitals:  ? 11/04/21  0911  ?BP: 104/67  ?Pulse: 68  ?Weight: 139 lb (63 kg)  ? ?Fetal Heart Rate (bpm): 163 ? ?   ?Pelvic Exam: deferred   ?    ?    ?    ?    ?    ?System: General: well-developed, well-nourished female in no acute distress  ? Breast:  normal appearance, no masses or tenderness  ? Skin: normal coloration and turgor, no rashes  ? Neurologic: oriented, normal, negative, normal mood  ? Extremities: normal strength, tone, and muscle mass, ROM of all joints is normal  ? HEENT PERRLA, extraocular movement intact and sclera clear, anicteric  ? Mouth/Teeth mucous membranes moist, pharynx normal without lesions and dental hygiene good  ? Neck supple and no masses  ? Cardiovascular: regular rate and rhythm  ? Respiratory:  no respiratory distress, normal breath sounds  ? Abdomen: soft, non-tender; bowel sounds normal; no masses,  no organomegaly  ? ?  ?Assessment:  ? ?Pregnancy: [id number] ?Patient Active Problem List  ? Diagnosis Date Noted  ? Advanced maternal age in multigravida 11/06/2021  ? Supervision of normal pregnancy 11/04/2021  ? ?  ?Plan:  ?1. Encounter for supervision of normal pregnancy in first trimester, unspecified gravidity ?- Culture, OB Urine ?- Obstetric panel ?- [place] MFM OB DETAIL +14 WK; Future ? ?2. Multigravida of advanced maternal age in first trimester ? ?3. [redacted] weeks gestation ? ? ?Initial labs drawn. ?Early 1 hour GTT n/a ?Started on ASA n/a ?Flu vax n/a ?Continue prenatal vitamins. ?Genetic Screening discussed, First trimester screen, Quad screen, and NIPS: declined. ?Ultrasound discussed; fetal anatomic survey: requested. ?Problem list reviewed and updated ?The nature of [facility] with multiple MDs and other Advanced Practice Providers was explained to patient; also emphasized that fellows, residents, and students are part of our team. ?Routine obstetric precautions reviewed ?Return in about 6 weeks (around 12/16/2021). ? ? ?Donette Larry, CNM ?11:31 AM 11/06/21  ? ?

## 2021-11-07 LAB — OBSTETRIC PANEL
Absolute Monocytes: 536 cells/uL (ref 200–950)
Antibody Screen: NOT DETECTED
Basophils Absolute: 29 cells/uL (ref 0–200)
Basophils Relative: 0.5 %
Eosinophils Absolute: 80 cells/uL (ref 15–500)
Eosinophils Relative: 1.4 %
HCT: 44.7 % (ref 35.0–45.0)
Hemoglobin: 14.6 g/dL (ref 11.7–15.5)
Hepatitis B Surface Ag: NONREACTIVE
Lymphs Abs: 1425 cells/uL (ref 850–3900)
MCH: 32 pg (ref 27.0–33.0)
MCHC: 32.7 g/dL (ref 32.0–36.0)
MCV: 98 fL (ref 80.0–100.0)
MPV: 11 fL (ref 7.5–12.5)
Monocytes Relative: 9.4 %
Neutro Abs: 3631 cells/uL (ref 1500–7800)
Neutrophils Relative %: 63.7 %
Platelets: 344 10*3/uL (ref 140–400)
RBC: 4.56 10*6/uL (ref 3.80–5.10)
RDW: 11.9 % (ref 11.0–15.0)
RPR Ser Ql: NONREACTIVE
Rubella: 2.65 Index
Total Lymphocyte: 25 %
WBC: 5.7 10*3/uL (ref 3.8–10.8)

## 2021-12-02 IMAGING — MR MR ABDOMEN WO/W CM
18 series · 48 of 48 positions shown · IV contrast (gadavist)
Comparison: Abdominal ultrasound, 05/04/2020

CLINICAL DATA: Characterize liver lesion

EXAM:
MRI ABDOMEN WITHOUT AND WITH CONTRAST
TECHNIQUE: Multiplanar multisequence MR imaging of the abdomen was performed
both before and after the administration of intravenous contrast.
CONTRAST:  7mL GADAVIST GADOBUTROL 1 MMOL/ML IV SOLN

[Series 4: cor haste · coronal · 6.0mm · 1.19mm/px · 2 of 30 slices shown]
[im 1/30]
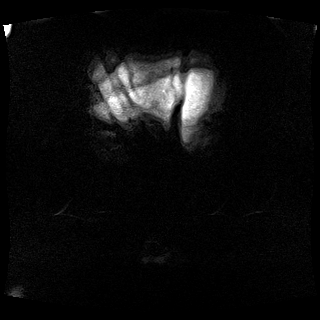
[im 30/30]
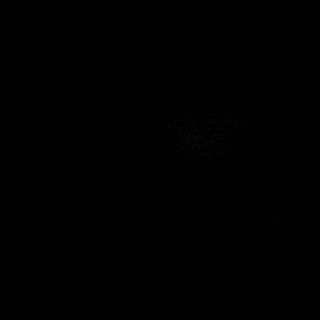

[Series 8: T2 fat-sat · axial · 6.0mm · 1.19mm/px · z∈[-104,+104]mm · 2 of 30 slices shown]
[im 1/30]
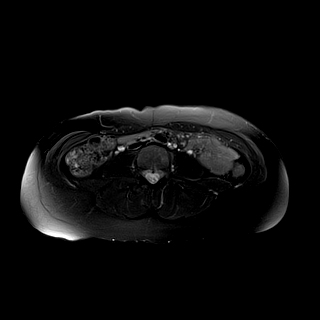
[im 30/30]
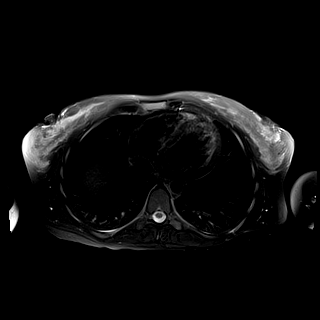

[Series 9: t1_vibe_opp-in_tra_p4_bh · axial · 3.0mm · 1.19mm/px · z∈[-122,+91]mm · 4 of 72 slices shown (1 of 2)]
[im 1/72]
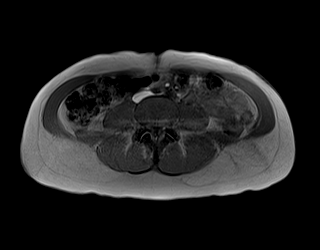
[im 24/72]
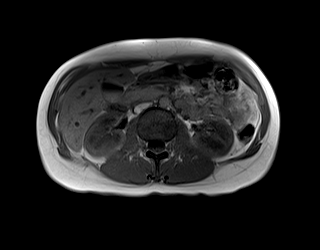
[im 48/72]
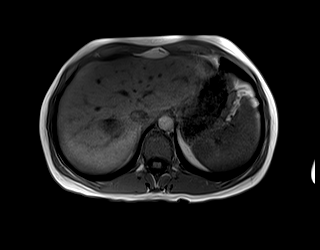
[im 72/72]
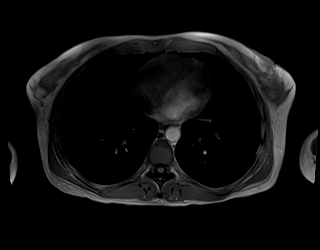

[Series 9: t1_vibe_opp-in_tra_p4_bh · axial · 3.0mm · 1.19mm/px · z∈[-122,+91]mm · 3 of 72 slices shown (2 of 2)]
[im 1/72]
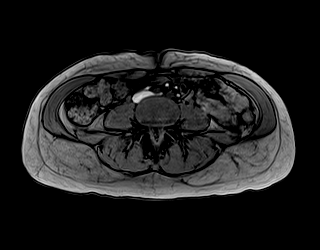
[im 36/72]
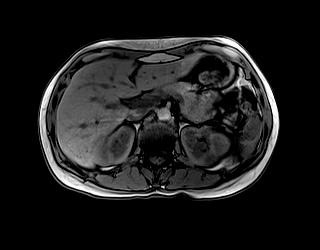
[im 72/72]
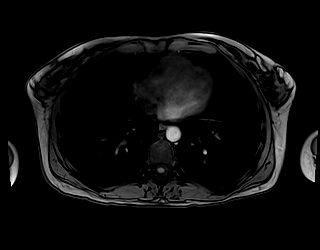

[Series 10: ax haste · axial · 6.0mm · 1.19mm/px · 1 of 30 slices shown]
[im 1/30]
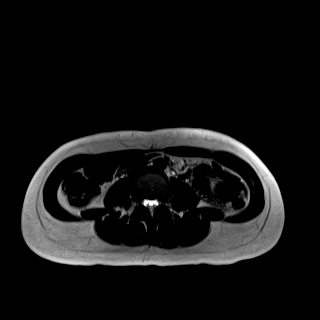

[Series 14: DWI · axial · 6.0mm · 1.42mm/px · z∈[-111,+98]mm · 4 of 90 slices shown (1 of 2)]
[im 1/90]
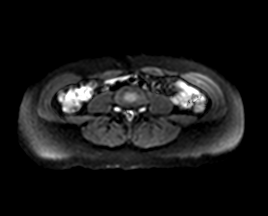
[im 30/90]
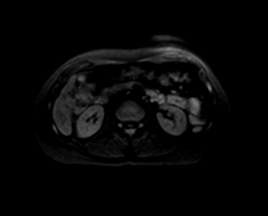
[im 60/90]
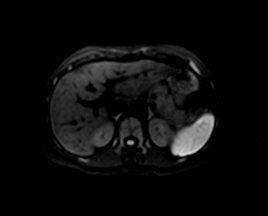
[im 90/90]
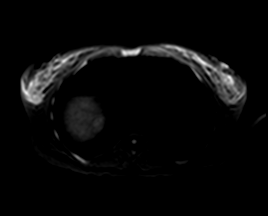

[Series 15: DWI · axial · 6.0mm · 1.42mm/px · 1 of 30 slices shown (2 of 2)]
[im 1/30]
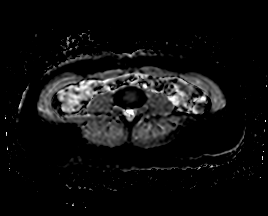

[Series 16: bSSFP · axial · 6.0mm · 0.74mm/px · 1 of 30 slices shown]
[im 1/30]
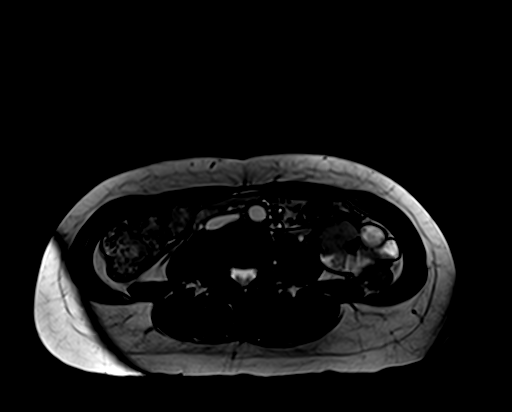

[Series 18: t1_vibe_fs_tra_p4_bh_pre · axial · 3.0mm · 1.19mm/px · z∈[-103,+110]mm · 3 of 72 slices shown]
[im 1/72]
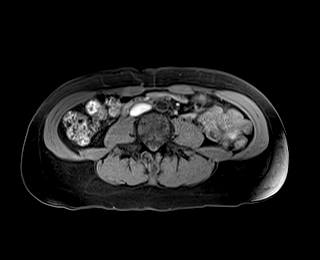
[im 36/72]
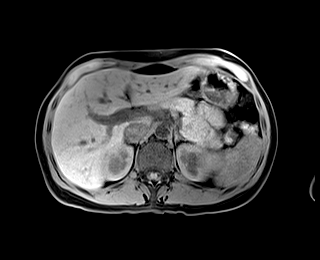
[im 72/72]
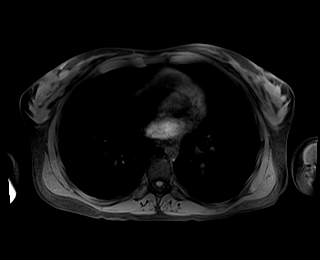

[Series 19: t1_vibe_fs_tra_p4_bh_post · axial · 3.0mm · 1.19mm/px · z∈[-103,+110]mm · 3 of 72 slices shown (1 of 4)]
[im 1/72]
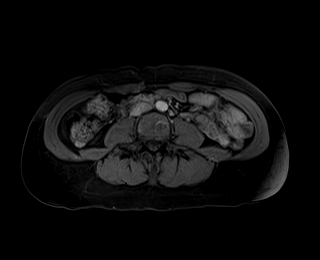
[im 36/72]
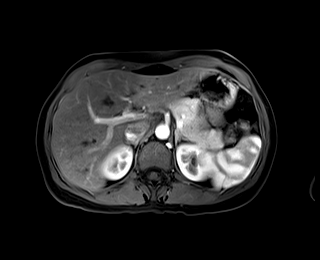
[im 72/72]
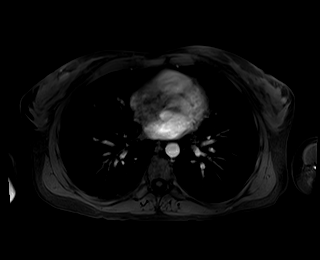

[Series 20: t1_vibe_fs_tra_p4_bh_post_sub · axial · 3.0mm · 1.19mm/px · z∈[-103,+110]mm · 3 of 72 slices shown (1 of 4)]
[im 1/72]
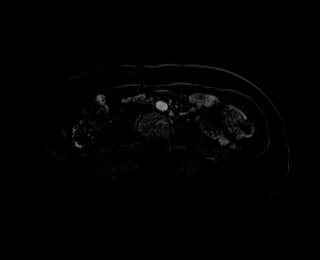
[im 36/72]
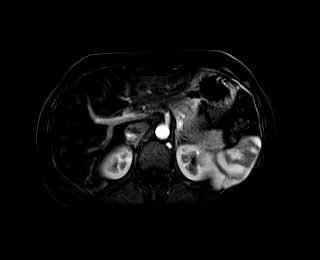
[im 72/72]
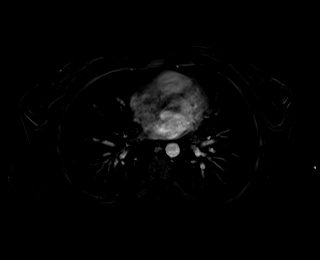

[Series 21: t1_vibe_fs_tra_p4_bh_post · axial · 3.0mm · 1.19mm/px · z∈[-103,+110]mm · 3 of 72 slices shown (2 of 4)]
[im 1/72]
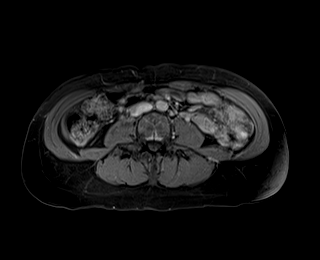
[im 36/72]
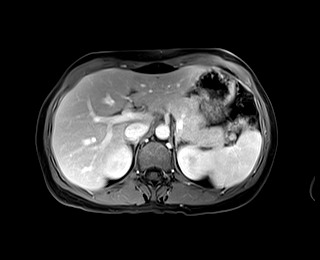
[im 72/72]
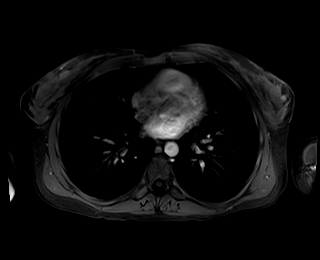

[Series 22: t1_vibe_fs_tra_p4_bh_post_sub · axial · 3.0mm · 1.19mm/px · z∈[-103,+110]mm · 3 of 72 slices shown (2 of 4)]
[im 1/72]
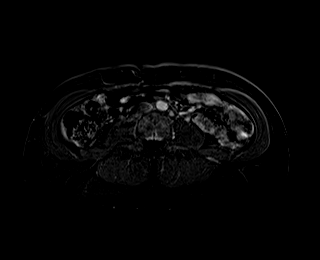
[im 36/72]
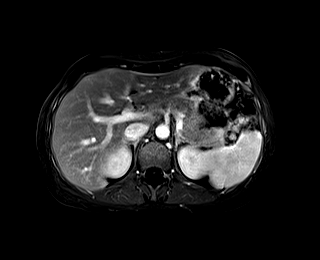
[im 72/72]
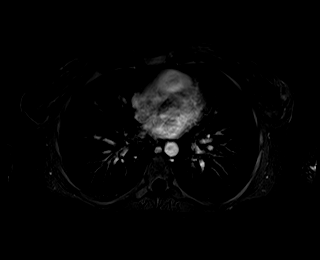

[Series 23: t1_vibe_fs_tra_p4_bh_post · axial · 3.0mm · 1.19mm/px · z∈[-103,+110]mm · 3 of 72 slices shown (3 of 4)]
[im 1/72]
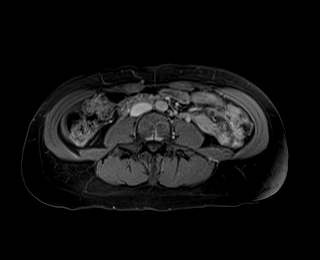
[im 36/72]
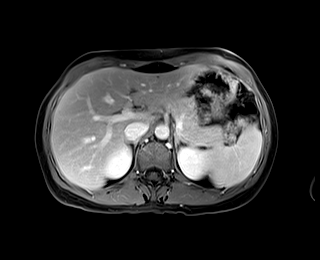
[im 72/72]
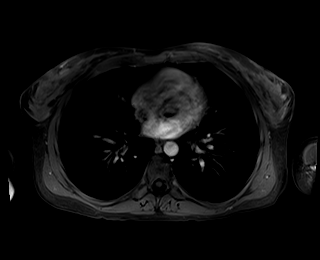

[Series 24: t1_vibe_fs_tra_p4_bh_post_sub · axial · 3.0mm · 1.19mm/px · z∈[-103,+110]mm · 3 of 72 slices shown (3 of 4)]
[im 1/72]
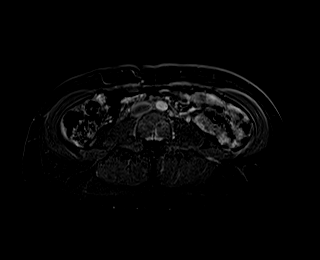
[im 36/72]
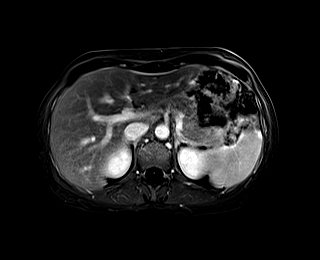
[im 72/72]
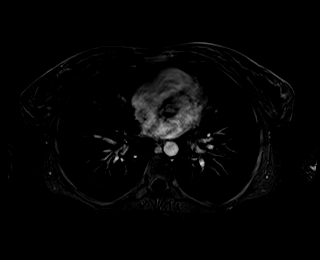

[Series 25: t1_vibe_fs_tra_p4_bh_post · axial · 3.0mm · 1.19mm/px · z∈[-103,+110]mm · 3 of 72 slices shown (4 of 4)]
[im 1/72]
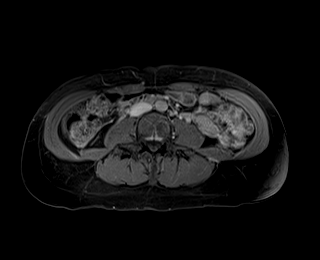
[im 36/72]
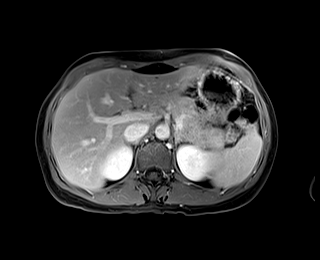
[im 72/72]
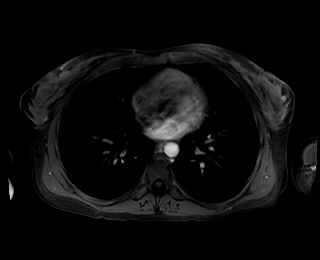

[Series 26: t1_vibe_fs_tra_p4_bh_post_sub · axial · 3.0mm · 1.19mm/px · z∈[-103,+110]mm · 3 of 72 slices shown (4 of 4)]
[im 1/72]
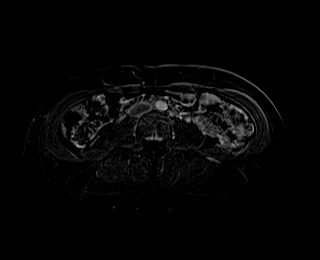
[im 36/72]
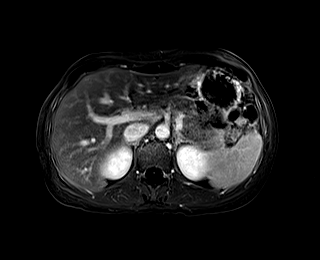
[im 72/72]
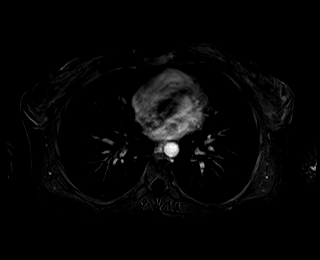

[Series 27: T1 dynamic post-contrast · coronal · 3.0mm · 1.31mm/px · 3 of 72 slices shown]
[im 1/72]
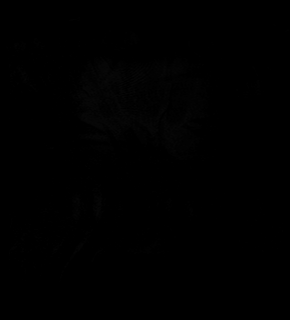
[im 36/72]
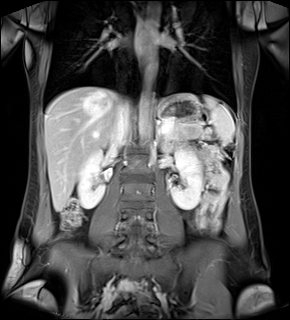
[im 72/72]
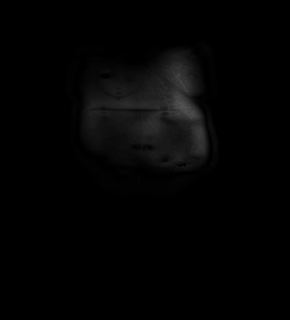

[48 of 48 positions shown; findings below may reference images not displayed]

FINDINGS: Lower chest: No acute findings.

Hepatobiliary: There is a fluid signal lesion of the central liver
dome measuring 4.1 x 3.9 cm, demonstrating progressive discontinuous
peripheral nodular enhancement on multiphasic contrast enhanced
sequences (series 19, image 22). No solid mass or other parenchymal
abnormality identified.

Pancreas: No mass, inflammatory changes, or other parenchymal
abnormality identified.

Spleen:  Within normal limits in size and appearance.

Adrenals/Urinary Tract: No masses identified. No evidence of
hydronephrosis.

Stomach/Bowel: Visualized portions within the abdomen are
unremarkable.

Vascular/Lymphatic: No pathologically enlarged lymph nodes
identified. No abdominal aortic aneurysm demonstrated.

Other:  None.

Musculoskeletal: No suspicious bone lesions identified.
IMPRESSION: 1. There is a fluid signal lesion of the central liver dome
measuring 4.1 x 3.9 cm, demonstrating progressive discontinuous
peripheral nodular enhancement on multiphasic contrast enhanced
sequences. This is consistent with a benign hepatic hemangioma. No
solid mass or suspicious hepatic lesion identified.

2. Opposed phase sequences were not performed or were not
transmitted to PACS for interpretation, and therefore hepatic
steatosis is not assessed. Examination will be addended if these
sequences are made available for review.

## 2021-12-13 ENCOUNTER — Ambulatory Visit: Payer: Self-pay

## 2021-12-13 VITALS — BP 110/69 | HR 84 | Wt 148.0 lb

## 2021-12-13 DIAGNOSIS — O09522 Supervision of elderly multigravida, second trimester: Secondary | ICD-10-CM

## 2021-12-13 DIAGNOSIS — Z3492 Encounter for supervision of normal pregnancy, unspecified, second trimester: Secondary | ICD-10-CM

## 2021-12-13 DIAGNOSIS — Z3A15 15 weeks gestation of pregnancy: Secondary | ICD-10-CM

## 2021-12-13 NOTE — Progress Notes (Signed)
   PRENATAL VISIT NOTE  Subjective:  Debra Larsen is a 41 y.o. [phone] at [redacted]w[redacted]d being seen today for ongoing prenatal care.  She is currently monitored for the following issues for this low-risk pregnancy and has Supervision of normal pregnancy and Advanced maternal age in multigravida on their problem list.  Patient reports no complaints.  Contractions: Not present. Vag. Bleeding: None.  Movement: Absent. Denies leaking of fluid.   The following portions of the patient's history were reviewed and updated as appropriate: allergies, current medications, past family history, past medical history, past social history, past surgical history and problem list.   Objective:   Vitals:   12/13/21 1427  BP: 110/69  Pulse: 84  Weight: 148 lb (67.1 kg)    Fetal Status: Fetal Heart Rate (bpm): 154   Movement: Absent     General:  Alert, oriented and cooperative. Patient is in no acute distress.  Skin: Skin is warm and dry. No rash noted.   Cardiovascular: Normal heart rate noted  Respiratory: Normal respiratory effort, no problems with respiration noted  Abdomen: Soft, gravid, appropriate for gestational age.  Pain/Pressure: Absent     Pelvic: Cervical exam deferred        Extremities: Normal range of motion.  Edema: None  Mental Status: Normal mood and affect. Normal behavior. Normal judgment and thought content.   Assessment and Plan:  Pregnancy: G4P3003 at [redacted]w[redacted]d 1. Encounter for supervision of normal pregnancy in second trimester, unspecified gravidity - Routine OB. Doing well.  - [place] MFM OB COMP + 14 WK; Future  2. [redacted] weeks gestation of pregnancy - Concerned that she has not felt baby move yet - Reassured patient that is normal and should start feeling movement by 20 weeks  3. Multigravida of advanced maternal age in second trimester - Declined genetics   Preterm labor symptoms and general obstetric precautions including but not limited to vaginal bleeding, contractions,  leaking of fluid and fetal movement were reviewed in detail with the patient. Please refer to After Visit Summary for other counseling recommendations.   Return in about 4 weeks (around 01/10/2022).  Future Appointments  Date Time Provider Department Center  01/09/2022  2:30 PM [facility] NURSE [facility]  01/09/2022  2:45 PM [facility] US4 [facility]  01/10/2022  2:30 PM Rasch, Harolyn Rutherford, NP [facility]    Brand Males, CNM

## 2022-01-09 ENCOUNTER — Ambulatory Visit: Payer: No Typology Code available for payment source | Attending: Certified Nurse Midwife

## 2022-01-09 ENCOUNTER — Ambulatory Visit: Payer: No Typology Code available for payment source | Admitting: *Deleted

## 2022-01-09 ENCOUNTER — Encounter: Payer: Self-pay | Admitting: *Deleted

## 2022-01-09 VITALS — BP 101/56 | HR 66

## 2022-01-09 DIAGNOSIS — Z3492 Encounter for supervision of normal pregnancy, unspecified, second trimester: Secondary | ICD-10-CM

## 2022-01-09 DIAGNOSIS — Z363 Encounter for antenatal screening for malformations: Secondary | ICD-10-CM | POA: Insufficient documentation

## 2022-01-09 DIAGNOSIS — O09522 Supervision of elderly multigravida, second trimester: Secondary | ICD-10-CM

## 2022-01-09 DIAGNOSIS — Z3A19 19 weeks gestation of pregnancy: Secondary | ICD-10-CM

## 2022-01-09 DIAGNOSIS — Z3491 Encounter for supervision of normal pregnancy, unspecified, first trimester: Secondary | ICD-10-CM | POA: Insufficient documentation

## 2022-01-10 ENCOUNTER — Ambulatory Visit (INDEPENDENT_AMBULATORY_CARE_PROVIDER_SITE_OTHER): Payer: Self-pay | Admitting: Obstetrics and Gynecology

## 2022-01-10 ENCOUNTER — Other Ambulatory Visit: Payer: Self-pay | Admitting: *Deleted

## 2022-01-10 ENCOUNTER — Telehealth: Payer: Self-pay

## 2022-01-10 VITALS — BP 133/80 | HR 91 | Wt 152.0 lb

## 2022-01-10 DIAGNOSIS — O09529 Supervision of elderly multigravida, unspecified trimester: Secondary | ICD-10-CM

## 2022-01-10 DIAGNOSIS — O09522 Supervision of elderly multigravida, second trimester: Secondary | ICD-10-CM

## 2022-01-10 DIAGNOSIS — Z3492 Encounter for supervision of normal pregnancy, unspecified, second trimester: Secondary | ICD-10-CM

## 2022-01-10 NOTE — Progress Notes (Signed)
   PRENATAL VISIT NOTE  Subjective:  Debra Larsen is a 41 y.o. [phone] at [redacted]w[redacted]d being seen today for ongoing prenatal care.  She is currently monitored for the following issues for this low-risk pregnancy and has Supervision of normal pregnancy and Advanced maternal age in multigravida on their problem list.  Patient reports no complaints.  Contractions: Not present. Vag. Bleeding: None.  Movement: Present. Denies leaking of fluid.   The following portions of the patient's history were reviewed and updated as appropriate: allergies, current medications, past family history, past medical history, past social history, past surgical history and problem list.   Objective:   Vitals:   01/10/22 1416  BP: 133/80  Pulse: 91  Weight: 152 lb (68.9 kg)    Fetal Status: Fetal Heart Rate (bpm): 154   Movement: Present     General:  Alert, oriented and cooperative. Patient is in no acute distress.  Skin: Skin is warm and dry. No rash noted.   Cardiovascular: Normal heart rate noted  Respiratory: Normal respiratory effort, no problems with respiration noted  Abdomen: Soft, gravid, appropriate for gestational age.  Pain/Pressure: Absent     Pelvic: Cervical exam deferred        Extremities: Normal range of motion.  Edema: None  Mental Status: Normal mood and affect. Normal behavior. Normal judgment and thought content.   Assessment and Plan:  Pregnancy: G4P3003 at [redacted]w[redacted]d   1. Encounter for supervision of normal pregnancy in second trimester, unspecified gravidity  - Declines all genetic testing.  - Interested in WB. Information provided to her today on how to access the WB class.   2. Multigravida of advanced maternal age in second trimester  - Growth [place] @ 30 Weeks - BPP starting weekly @ 36 weeks. Patient would like to do testing in the [place] office.   Preterm labor symptoms and general obstetric precautions including but not limited to vaginal bleeding, contractions, leaking of  fluid and fetal movement were reviewed in detail with the patient. Please refer to After Visit Summary for other counseling recommendations.   No follow-ups on file.  Future Appointments  Date Time Provider Department Center  02/07/2022  3:10 PM Kamuela Magos, Harolyn Rutherford, NP [facility]  04/11/2022  2:30 PM [facility]URSE [facility]  04/11/2022  2:45 PM [facility] US5 [facility]  05/08/2022  2:30 PM [facility]SE [facility]  05/08/2022  2:45 PM [facility]5 [facility]    Venia Carbon, NP

## 2022-02-07 ENCOUNTER — Ambulatory Visit (INDEPENDENT_AMBULATORY_CARE_PROVIDER_SITE_OTHER): Payer: Self-pay | Admitting: Obstetrics and Gynecology

## 2022-02-07 VITALS — BP 102/66 | HR 71 | Wt 158.0 lb

## 2022-02-07 DIAGNOSIS — Z3492 Encounter for supervision of normal pregnancy, unspecified, second trimester: Secondary | ICD-10-CM

## 2022-02-07 DIAGNOSIS — O09522 Supervision of elderly multigravida, second trimester: Secondary | ICD-10-CM

## 2022-02-07 NOTE — Progress Notes (Signed)
   PRENATAL VISIT NOTE  Subjective:  Debra Larsen is a 41 y.o. [phone] at [redacted]w[redacted]d being seen today for ongoing prenatal care.  She is currently monitored for the following issues for this low-risk pregnancy and has Supervision of normal pregnancy and Advanced maternal age in multigravida on their problem list.  Patient reports no complaints.  Contractions: Not present. Vag. Bleeding: None.  Movement: Present. Denies leaking of fluid.   The following portions of the patient's history were reviewed and updated as appropriate: allergies, current medications, past family history, past medical history, past social history, past surgical history and problem list.   Objective:   Vitals:   02/07/22 1516  BP: 102/66  Pulse: 71  Weight: 158 lb (71.7 kg)    Fetal Status: Fetal Heart Rate (bpm): 156   Movement: Present     General:  Alert, oriented and cooperative. Patient is in no acute distress.  Skin: Skin is warm and dry. No rash noted.   Cardiovascular: Normal heart rate noted  Respiratory: Normal respiratory effort, no problems with respiration noted  Abdomen: Soft, gravid, appropriate for gestational age.  Pain/Pressure: Absent     Pelvic: Cervical exam deferred        Extremities: Normal range of motion.  Edema: None  Mental Status: Normal mood and affect. Normal behavior. Normal judgment and thought content.   Assessment and Plan:  Pregnancy: G4P3003 at [redacted]w[redacted]d  1. Multigravida of advanced maternal age in second trimester  Advanced Maternal Age ? 41 y.o.  32 36 39-40   She is unsure about growth [place] at 32 weeks d/t MFM cost.  She will continue to consider in office testing at 36 weeks with AFI/ NST   2. Encounter for supervision of normal pregnancy in second trimester, unspecified gravidity  Doing well, good fetal movement Declines glucose testing. Requests at home finger sticks including fasting BS, and 2 hour post prandials reading. She will document readings for two weeks  and bring log.  Reviewed WB class Next visit with CNM to discuss WB further.    Preterm labor symptoms and general obstetric precautions including but not limited to vaginal bleeding, contractions, leaking of fluid and fetal movement were reviewed in detail with the patient. Please refer to After Visit Summary for other counseling recommendations.   Return in about 4 weeks (around 03/07/2022), or Please schedule with CNM to discuss WB.  Future Appointments  Date Time Provider Department Center  03/07/2022  2:30 PM Rober Minion [facility]  04/11/2022  2:30 PM [facility] NURSE [facility]  04/11/2022  2:45 PM [facility] US5 [facility]  05/08/2022  2:30 PM [facility]URSE [facility]  05/08/2022  2:45 PM [facility] US5 [facility]    Venia Carbon, NP

## 2022-03-07 ENCOUNTER — Ambulatory Visit (INDEPENDENT_AMBULATORY_CARE_PROVIDER_SITE_OTHER): Payer: Self-pay | Admitting: Certified Nurse Midwife

## 2022-03-07 VITALS — BP 105/69 | HR 69 | Wt 161.0 lb

## 2022-03-07 DIAGNOSIS — Z3492 Encounter for supervision of normal pregnancy, unspecified, second trimester: Secondary | ICD-10-CM

## 2022-03-07 DIAGNOSIS — Z3A27 27 weeks gestation of pregnancy: Secondary | ICD-10-CM

## 2022-03-07 NOTE — Progress Notes (Signed)
Subjective:  Debra Larsen is a 41 y.o. [phone] at [redacted]w[redacted]d being seen today for ongoing prenatal care.  She is currently monitored for the following issues for this high-risk pregnancy and has Supervision of normal pregnancy and Advanced maternal age in multigravida on their problem list.  Patient reports no complaints.  Contractions: Not present. Vag. Bleeding: None.  Movement: Present. Denies leaking of fluid.   The following portions of the patient's history were reviewed and updated as appropriate: allergies, current medications, past family history, past medical history, past social history, past surgical history and problem list. Problem list updated.  Objective:   Vitals:   03/07/22 1422  BP: 105/69  Pulse: 69  Weight: 161 lb (73 kg)    Fetal Status: Fetal Heart Rate (bpm): 148 Fundal Height: 28 cm Movement: Present  Presentation: Undeterminable  General:  Alert, oriented and cooperative. Patient is in no acute distress.  Skin: Skin is warm and dry. No rash noted.   Cardiovascular: Normal heart rate noted  Respiratory: Normal respiratory effort, no problems with respiration noted  Abdomen: Soft, gravid, appropriate for gestational age. Pain/Pressure: Absent     Pelvic: Vag. Bleeding: None Vag D/C Character: Thin   Cervical exam deferred        Extremities: Normal range of motion.  Edema: None  Mental Status: Normal mood and affect. Normal behavior. Normal judgment and thought content.   Urinalysis:      Assessment and Plan:  Pregnancy: G4P3003 at [redacted]w[redacted]d  1. Encounter for supervision of normal pregnancy in second trimester, unspecified gravidity  2. [redacted] weeks gestation of pregnancy  -declines GTT -brought home glucose log and all within normal range; scanned into chart  3. AMA >40 -pt is considering growth [place] but may decline  Preterm labor symptoms and general obstetric precautions including but not limited to vaginal bleeding, contractions, leaking of fluid and fetal  movement were reviewed in detail with the patient. Please refer to After Visit Summary for other counseling recommendations.  Return in about 3 weeks (around 03/28/2022).   Donette Larry, CNM

## 2022-03-07 NOTE — Progress Notes (Signed)
Pt declined  Tdap

## 2022-03-28 ENCOUNTER — Ambulatory Visit (INDEPENDENT_AMBULATORY_CARE_PROVIDER_SITE_OTHER): Payer: Self-pay | Admitting: Obstetrics and Gynecology

## 2022-03-28 VITALS — BP 107/69 | HR 79 | Wt 164.0 lb

## 2022-03-28 DIAGNOSIS — O09522 Supervision of elderly multigravida, second trimester: Secondary | ICD-10-CM

## 2022-03-28 DIAGNOSIS — Z3A30 30 weeks gestation of pregnancy: Secondary | ICD-10-CM

## 2022-03-28 DIAGNOSIS — Z3492 Encounter for supervision of normal pregnancy, unspecified, second trimester: Secondary | ICD-10-CM

## 2022-03-28 NOTE — Progress Notes (Signed)
   PRENATAL VISIT NOTE  Subjective:  Debra Larsen is a 41 y.o. [phone] at [redacted]w[redacted]d being seen today for ongoing prenatal care.  She is currently monitored for the following issues for this high-risk pregnancy and has Supervision of normal pregnancy and Advanced maternal age in multigravida on their problem list.  Patient reports no complaints.  Contractions: Not present. Vag. Bleeding: None.  Movement: Present. Denies leaking of fluid.   The following portions of the patient's history were reviewed and updated as appropriate: allergies, current medications, past family history, past medical history, past social history, past surgical history and problem list.   Objective:   Vitals:   03/28/22 1433  BP: 107/69  Pulse: 79  Weight: 164 lb (74.4 kg)    Fetal Status: Fetal Heart Rate (bpm): 142 Fundal Height: 31 cm Movement: Present     General:  Alert, oriented and cooperative. Patient is in no acute distress.  Skin: Skin is warm and dry. No rash noted.   Cardiovascular: Normal heart rate noted  Respiratory: Normal respiratory effort, no problems with respiration noted  Abdomen: Soft, gravid, appropriate for gestational age.  Pain/Pressure: Absent     Pelvic: Cervical exam deferred        Extremities: Normal range of motion.  Edema: None  Mental Status: Normal mood and affect. Normal behavior. Normal judgment and thought content.   Assessment and Plan:  Pregnancy: G4P3003 at [redacted]w[redacted]d  1. Multigravida of advanced maternal age in second trimester   2. Encounter for supervision of normal pregnancy in second trimester, unspecified gravidity  Doing well Good fetal movement Not on ASA Recommended growth [place] starting at 32 weeks- patient declines at this time. Recommend NST in the office with AFI @ 36 weeks, she will consider this as an option. We can continue to discuss throughout pregnancy. Fetal kick counts reviewed  Per MFM guidelines, delivery is recommended at 39-40 weeks.     Preterm labor symptoms and general obstetric precautions including but not limited to vaginal bleeding, contractions, leaking of fluid and fetal movement were reviewed in detail with the patient. Please refer to After Visit Summary for other counseling recommendations.   No follow-ups on file.  Future Appointments  Date Time Provider Department Center  04/11/2022  4:10 PM Cortlin Marano, Harolyn Rutherford, NP [facility]    Venia Carbon, NP

## 2022-04-11 ENCOUNTER — Ambulatory Visit: Payer: No Typology Code available for payment source

## 2022-04-11 ENCOUNTER — Ambulatory Visit: Payer: Self-pay

## 2022-04-11 ENCOUNTER — Ambulatory Visit (INDEPENDENT_AMBULATORY_CARE_PROVIDER_SITE_OTHER): Payer: Self-pay | Admitting: Obstetrics and Gynecology

## 2022-04-11 DIAGNOSIS — Z3492 Encounter for supervision of normal pregnancy, unspecified, second trimester: Secondary | ICD-10-CM

## 2022-04-11 DIAGNOSIS — Z3A32 32 weeks gestation of pregnancy: Secondary | ICD-10-CM

## 2022-04-11 NOTE — Progress Notes (Signed)
   PRENATAL VISIT NOTE  Subjective:  Debra Larsen is a 41 y.o. [phone] at [redacted]w[redacted]d being seen today for ongoing prenatal care.  She is currently monitored for the following issues for this low-risk pregnancy and has Supervision of normal pregnancy and Advanced maternal age in multigravida on their problem list.  Patient reports no complaints.  Contractions: Not present. Vag. Bleeding: None.  Movement: Present. Denies leaking of fluid.   The following portions of the patient's history were reviewed and updated as appropriate: allergies, current medications, past family history, past medical history, past social history, past surgical history and problem list.   Objective:   Vitals:   04/11/22 1605  BP: 107/73  Pulse: 85  Weight: 166 lb (75.3 kg)    Fetal Status: Fetal Heart Rate (bpm): 145   Movement: Present     General:  Alert, oriented and cooperative. Patient is in no acute distress.  Skin: Skin is warm and dry. No rash noted.   Cardiovascular: Normal heart rate noted  Respiratory: Normal respiratory effort, no problems with respiration noted  Abdomen: Soft, gravid, appropriate for gestational age.  Pain/Pressure: Absent     Pelvic: Cervical exam deferred        Extremities: Normal range of motion.  Edema: None  Mental Status: Normal mood and affect. Normal behavior. Normal judgment and thought content.   Assessment and Plan:  Pregnancy: [id number] at [redacted]w[redacted]d  Doing well Good fetal movement Not on ASA Recommended growth [place] starting at 32 weeks- patient declines at this time. Recommend Antenatal testing starting at 36 weeks. She is considering NST in the office with AFI @ 36 weeks as an alternative option.  We can continue to discuss throughout pregnancy. Fetal kick counts reviewed  Per MFM guidelines, delivery is recommended at 39-40 weeks. GBS @ 36W Complete WB course- this was scanned into media She is meeting with Andee Poles CNM next visit.   Preterm labor symptoms and  general obstetric precautions including but not limited to vaginal bleeding, contractions, leaking of fluid and fetal movement were reviewed in detail with the patient. Please refer to After Visit Summary for other counseling recommendations.   No follow-ups on file.  Future Appointments  Date Time Provider [place]  04/25/2022  3:10 PM Renee Harder, CNM [facility]  05/09/2022  2:50 PM Haitham Dolinsky, Artist Pais, NP [facility]  05/16/2022  2:50 PM Jaylenn Altier, Artist Pais, NP [facility]  05/23/2022  2:50 PM Renee Harder, CNM [facility]  05/30/2022  2:50 PM Jamian Andujo, Artist Pais, NP [facility]    Noni Saupe, NP

## 2022-04-25 ENCOUNTER — Ambulatory Visit: Payer: Self-pay

## 2022-04-25 VITALS — BP 101/67 | HR 85 | Wt 170.1 lb

## 2022-04-25 DIAGNOSIS — Z3A34 34 weeks gestation of pregnancy: Secondary | ICD-10-CM

## 2022-04-25 DIAGNOSIS — Z3483 Encounter for supervision of other normal pregnancy, third trimester: Secondary | ICD-10-CM

## 2022-04-25 DIAGNOSIS — Z3493 Encounter for supervision of normal pregnancy, unspecified, third trimester: Secondary | ICD-10-CM

## 2022-04-25 NOTE — Progress Notes (Signed)
   PRENATAL VISIT NOTE  Subjective:  Debra Larsen is a 41 y.o. [phone] at [redacted]w[redacted]d being seen today for ongoing prenatal care.  She is currently monitored for the following issues for this low-risk pregnancy and has Supervision of normal pregnancy and Advanced maternal age in multigravida on their problem list.  Patient reports no complaints.  Contractions: Not present. Vag. Bleeding: None.  Movement: Present. Denies leaking of fluid.   The following portions of the patient's history were reviewed and updated as appropriate: allergies, current medications, past family history, past medical history, past social history, past surgical history and problem list.   Objective:   Vitals:   04/25/22 1502  BP: 101/67  Pulse: 85  Weight: 170 lb 1.3 oz (77.1 kg)    Fetal Status: Fetal Heart Rate (bpm): 141 Fundal Height: 34 cm Movement: Present     General:  Alert, oriented and cooperative. Patient is in no acute distress.  Skin: Skin is warm and dry. No rash noted.   Cardiovascular: Normal heart rate noted  Respiratory: Normal respiratory effort, no problems with respiration noted  Abdomen: Soft, gravid, appropriate for gestational age.  Pain/Pressure: Absent     Pelvic: Cervical exam deferred        Extremities: Normal range of motion.  Edema: None  Mental Status: Normal mood and affect. Normal behavior. Normal judgment and thought content.   Assessment and Plan:  Pregnancy: G4P3003 at [redacted]w[redacted]d 1. Encounter for supervision of normal pregnancy in third trimester, unspecified gravidity - Routine OB. Doing well. No concerns - Anticipatory guidance for upcoming appointments provided. Patient okay with GBS at next appointment but declines GC/CT - Reviewed recommendation for growth [place] and ANT at 36 weeks. Patient declines at this time, but is considering NST/AFI in office. Will discuss at next appointment - Plans water birth  2. [redacted] weeks gestation of pregnancy - FH appropriate - Endorses  active fetal movement   Preterm labor symptoms and general obstetric precautions including but not limited to vaginal bleeding, contractions, leaking of fluid and fetal movement were reviewed in detail with the patient. Please refer to After Visit Summary for other counseling recommendations.   Return in about 2 weeks (around 05/09/2022) for LOB.  Future Appointments  Date Time Provider [place]  05/09/2022  2:50 PM Rasch, Artist Pais, NP [facility]  05/16/2022  2:50 PM Rasch, Artist Pais, NP [facility]  05/23/2022  2:50 PM Renee Harder, CNM [facility]  05/30/2022  2:50 PM Rasch, Artist Pais, NP [place] [facility]    Renee Harder, CNM

## 2022-05-08 ENCOUNTER — Ambulatory Visit: Payer: No Typology Code available for payment source

## 2022-05-09 ENCOUNTER — Ambulatory Visit (INDEPENDENT_AMBULATORY_CARE_PROVIDER_SITE_OTHER): Payer: Self-pay | Admitting: Obstetrics and Gynecology

## 2022-05-09 VITALS — BP 108/71 | HR 78 | Wt 170.0 lb

## 2022-05-09 DIAGNOSIS — Z3A36 36 weeks gestation of pregnancy: Secondary | ICD-10-CM

## 2022-05-09 DIAGNOSIS — Z3493 Encounter for supervision of normal pregnancy, unspecified, third trimester: Secondary | ICD-10-CM

## 2022-05-09 DIAGNOSIS — Z3483 Encounter for supervision of other normal pregnancy, third trimester: Secondary | ICD-10-CM

## 2022-05-09 LAB — OB RESULTS CONSOLE GBS: GBS: NEGATIVE

## 2022-05-09 NOTE — Progress Notes (Signed)
   PRENATAL VISIT NOTE  Subjective:  Debra Larsen is a 41 y.o. [phone] at [redacted]w[redacted]d being seen today for ongoing prenatal care.  She is currently monitored for the following issues for this high-risk pregnancy and has Supervision of normal pregnancy and Advanced maternal age in multigravida on their problem list.  Patient reports no complaints.  Contractions: Not present. Vag. Bleeding: None.  Movement: Present. Denies leaking of fluid.   The following portions of the patient's history were reviewed and updated as appropriate: allergies, current medications, past family history, past medical history, past social history, past surgical history and problem list.   Objective:   Vitals:   05/09/22 1435  BP: 108/71  Pulse: 78  Weight: 170 lb (77.1 kg)    Fetal Status: Fetal Heart Rate (bpm): 145 Fundal Height: 37 cm Movement: Present  Presentation: Vertex  General:  Alert, oriented and cooperative. Patient is in no acute distress.  Skin: Skin is warm and dry. No rash noted.   Cardiovascular: Normal heart rate noted  Respiratory: Normal respiratory effort, no problems with respiration noted  Abdomen: Soft, gravid, appropriate for gestational age.  Pain/Pressure: Absent     Pelvic: Cervical exam performed in the presence of a chaperone Dilation: 1 Effacement (%): Thick Station: Ballotable, -3  Extremities: Normal range of motion.     Mental Status: Normal mood and affect. Normal behavior. Normal judgment and thought content.   Assessment and Plan:  Pregnancy: G4P3003 at [redacted]w[redacted]d 1. Encounter for supervision of normal pregnancy in third trimester, unspecified gravidity  - patient declined GC swab -BP good  - She has declined Antenatal testing and growth [place]. She was considering BPP, NST, AFI; discussed today and she declined. Reports really good fetal movement.  Risk of fetal demise given AMA, she is aware of the risks.  - Culture, beta strep (group b only) - She requests cervical exam  today.   Preterm labor symptoms and general obstetric precautions including but not limited to vaginal bleeding, contractions, leaking of fluid and fetal movement were reviewed in detail with the patient. Please refer to After Visit Summary for other counseling recommendations.   No follow-ups on file.  Future Appointments  Date Time Provider [place]  05/16/2022  2:50 PM Anyiah Coverdale, Artist Pais, NP [facility]  05/23/2022  2:50 PM Renee Harder, CNM [facility]  05/30/2022  2:50 PM Aamari West, Artist Pais, NP [facility]    Noni Saupe, NP

## 2022-05-12 LAB — CULTURE, BETA STREP (GROUP B ONLY)
MICRO NUMBER:: 14097834
SPECIMEN QUALITY:: ADEQUATE

## 2022-05-16 ENCOUNTER — Ambulatory Visit (INDEPENDENT_AMBULATORY_CARE_PROVIDER_SITE_OTHER): Payer: Self-pay | Admitting: Obstetrics and Gynecology

## 2022-05-16 VITALS — BP 104/67 | HR 76 | Wt 171.0 lb

## 2022-05-16 DIAGNOSIS — Z3493 Encounter for supervision of normal pregnancy, unspecified, third trimester: Secondary | ICD-10-CM

## 2022-05-16 DIAGNOSIS — O09522 Supervision of elderly multigravida, second trimester: Secondary | ICD-10-CM

## 2022-05-16 DIAGNOSIS — Z3A37 37 weeks gestation of pregnancy: Secondary | ICD-10-CM

## 2022-05-16 NOTE — Progress Notes (Signed)
   PRENATAL VISIT NOTE  Subjective:  Debra Larsen is a 41 y.o. [phone] at [redacted]w[redacted]d being seen today for ongoing prenatal care.  She is currently monitored for the following issues for this low-risk pregnancy and has Supervision of normal pregnancy and Advanced maternal age in multigravida on their problem list.  Patient reports no complaints.  Contractions: Not present. Vag. Bleeding: None.  Movement: Present. Denies leaking of fluid.   The following portions of the patient's history were reviewed and updated as appropriate: allergies, current medications, past family history, past medical history, past social history, past surgical history and problem list.   Objective:   Vitals:   05/16/22 1452  BP: 104/67  Pulse: 76  Weight: 171 lb (77.6 kg)    Fetal Status: Fetal Heart Rate (bpm): 135   Movement: Present     General:  Alert, oriented and cooperative. Patient is in no acute distress.  Skin: Skin is warm and dry. No rash noted.   Cardiovascular: Normal heart rate noted  Respiratory: Normal respiratory effort, no problems with respiration noted  Abdomen: Soft, gravid, appropriate for gestational age.  Pain/Pressure: Absent     Pelvic: Cervical exam deferred        Extremities: Normal range of motion.  Edema: None  Mental Status: Normal mood and affect. Normal behavior. Normal judgment and thought content.   Assessment and Plan:  Pregnancy: G4P3003 at [redacted]w[redacted]d   1. Encounter for supervision of normal pregnancy in third trimester, unspecified gravidity  Good fetal movement reported BP good.  No concerns   2. Multigravida of advanced maternal age in second trimester  She has declined Antenatal testing and growth [place]. She was considering BPP, NST, AFI; and she declined. Reports really good fetal movement.  Risk of fetal demise given AMA, she is aware of the risks.   Term labor symptoms and general obstetric precautions including but not limited to vaginal bleeding,  contractions, leaking of fluid and fetal movement were reviewed in detail with the patient. Please refer to After Visit Summary for other counseling recommendations.   No follow-ups on file.  Future Appointments  Date Time Provider [place]  05/23/2022  2:50 PM Renee Harder, CNM [facility]  05/30/2022  2:50 PM Jamicah Anstead, Artist Pais, NP [facility]    Noni Saupe, NP

## 2022-05-23 ENCOUNTER — Ambulatory Visit (INDEPENDENT_AMBULATORY_CARE_PROVIDER_SITE_OTHER): Payer: Self-pay

## 2022-05-23 VITALS — BP 114/76 | HR 76 | Wt 167.0 lb

## 2022-05-23 DIAGNOSIS — Z3493 Encounter for supervision of normal pregnancy, unspecified, third trimester: Secondary | ICD-10-CM

## 2022-05-23 DIAGNOSIS — Z3A38 38 weeks gestation of pregnancy: Secondary | ICD-10-CM

## 2022-05-23 DIAGNOSIS — O09523 Supervision of elderly multigravida, third trimester: Secondary | ICD-10-CM

## 2022-05-23 NOTE — Progress Notes (Signed)
   PRENATAL VISIT NOTE  Subjective:  Debra Larsen is a 41 y.o. [phone] at [redacted]w[redacted]d being seen today for ongoing prenatal care.  She is currently monitored for the following issues for this low-risk pregnancy and has Supervision of normal pregnancy and Advanced maternal age in multigravida on their problem list.  Patient reports occasional contractions.  Contractions: Irritability. Vag. Bleeding: None.  Movement: Present. Denies leaking of fluid.   The following portions of the patient's history were reviewed and updated as appropriate: allergies, current medications, past family history, past medical history, past social history, past surgical history and problem list.   Objective:   Vitals:   05/23/22 1435  BP: 114/76  Pulse: 76  Weight: 167 lb (75.8 kg)    Fetal Status: Fetal Heart Rate (bpm): 140 Fundal Height: 37 cm Movement: Present     General:  Alert, oriented and cooperative. Patient is in no acute distress.  Skin: Skin is warm and dry. No rash noted.   Cardiovascular: Normal heart rate noted  Respiratory: Normal respiratory effort, no problems with respiration noted  Abdomen: Soft, gravid, appropriate for gestational age.  Pain/Pressure: Present     Pelvic: Cervical exam performed in the presence of a chaperone Dilation: 2.5 Effacement (%): 50 Station: -3  Extremities: Normal range of motion.  Edema: None  Mental Status: Normal mood and affect. Normal behavior. Normal judgment and thought content.   Assessment and Plan:  Pregnancy: G4P3003 at [redacted]w[redacted]d 1. Encounter for supervision of normal pregnancy in third trimester, unspecified gravidity - Routine OB. Doing well. No concerns - Occasional contractions. Continue dates, RRT, walks, ball, etc - Plans for hydrotherapy and/or waterbirth  2. Multigravida of advanced maternal age in third trimester - Declined growth [place] and ANT. See previous notes  3. [redacted] weeks gestation of pregnancy - FH appropriate - Endorses active  fetal movement  Term labor symptoms and general obstetric precautions including but not limited to vaginal bleeding, contractions, leaking of fluid and fetal movement were reviewed in detail with the patient. Please refer to After Visit Summary for other counseling recommendations.   Return in about 1 week (around 05/30/2022) for LOB.  Future Appointments  Date Time Provider [place]  05/30/2022  2:50 PM Rasch, Artist Pais, NP [facility]    Renee Harder, CNM

## 2022-05-30 ENCOUNTER — Telehealth: Payer: Self-pay | Admitting: *Deleted

## 2022-05-30 ENCOUNTER — Ambulatory Visit (INDEPENDENT_AMBULATORY_CARE_PROVIDER_SITE_OTHER): Payer: Self-pay | Admitting: Obstetrics and Gynecology

## 2022-05-30 VITALS — BP 113/71 | HR 64 | Wt 168.0 lb

## 2022-05-30 DIAGNOSIS — Z3A39 39 weeks gestation of pregnancy: Secondary | ICD-10-CM

## 2022-05-30 DIAGNOSIS — Z3493 Encounter for supervision of normal pregnancy, unspecified, third trimester: Secondary | ICD-10-CM

## 2022-05-30 NOTE — Telephone Encounter (Signed)
Left patient an urgent message to call and schedule 40 week appointment for 06/06/2022 in the PM.

## 2022-05-30 NOTE — Progress Notes (Signed)
   PRENATAL VISIT NOTE  Subjective:  Debra Larsen is a 41 y.o. [phone] at [redacted]w[redacted]d being seen today for ongoing prenatal care.  She is currently monitored for the following issues for this low-risk pregnancy and has Supervision of normal pregnancy and Advanced maternal age in multigravida on their problem list.  Patient reports no complaints.  Contractions: Irritability. Vag. Bleeding: None.  Movement: Present. Denies leaking of fluid.   The following portions of the patient's history were reviewed and updated as appropriate: allergies, current medications, past family history, past medical history, past social history, past surgical history and problem list.   Objective:   Vitals:   05/30/22 1533  BP: 113/71  Pulse: 64  Weight: 168 lb (76.2 kg)    Fetal Status: Fetal Heart Rate (bpm): 127 Fundal Height: 38 cm Movement: Present     General:  Alert, oriented and cooperative. Patient is in no acute distress.  Skin: Skin is warm and dry. No rash noted.   Cardiovascular: Normal heart rate noted  Respiratory: Normal respiratory effort, no problems with respiration noted  Abdomen: Soft, gravid, appropriate for gestational age.  Pain/Pressure: Present     Pelvic: Cervical exam deferred        Extremities: Normal range of motion.  Edema: None  Mental Status: Normal mood and affect. Normal behavior. Normal judgment and thought content.   Assessment and Plan:  Pregnancy: G4P3003 at [redacted]w[redacted]d  1. Encounter for supervision of normal pregnancy in third trimester, unspecified gravidity  Declined NST today. Recommend NST with post dates visit next week; patient declined at this time. Reports good fetal movement BP good Declined antenatal testing  Declined Growth US's with MFM.  Labor precautions discussed.   TERM labor symptoms and general obstetric precautions including but not limited to vaginal bleeding, contractions, leaking of fluid and fetal movement were reviewed in detail with the  patient. Please refer to After Visit Summary for other counseling recommendations.   No follow-ups on file.  No future appointments.  Venia Carbon, NP

## 2022-06-05 ENCOUNTER — Other Ambulatory Visit: Payer: Self-pay

## 2022-06-05 ENCOUNTER — Inpatient Hospital Stay (HOSPITAL_COMMUNITY)
Admission: AD | Admit: 2022-06-05 | Discharge: 2022-06-05 | Disposition: A | Discharge status: Home / Self Care | Payer: No Typology Code available for payment source | Attending: Family Medicine | Admitting: Family Medicine

## 2022-06-05 ENCOUNTER — Telehealth: Payer: Self-pay

## 2022-06-05 ENCOUNTER — Encounter (HOSPITAL_COMMUNITY): Payer: Self-pay | Admitting: Family Medicine

## 2022-06-05 DIAGNOSIS — Z0371 Encounter for suspected problem with amniotic cavity and membrane ruled out: Secondary | ICD-10-CM

## 2022-06-05 DIAGNOSIS — O09523 Supervision of elderly multigravida, third trimester: Secondary | ICD-10-CM | POA: Insufficient documentation

## 2022-06-05 DIAGNOSIS — Z3A40 40 weeks gestation of pregnancy: Secondary | ICD-10-CM | POA: Insufficient documentation

## 2022-06-05 DIAGNOSIS — O471 False labor at or after 37 completed weeks of gestation: Secondary | ICD-10-CM | POA: Insufficient documentation

## 2022-06-05 LAB — POCT FERN TEST: POCT Fern Test: NEGATIVE

## 2022-06-05 NOTE — Telephone Encounter (Signed)
Pt called stating that she thinks she is leaking fluid and she is having contractions. Pt states the contractions aren't "stopping her in her tracks" but, she has been feeling them. I spoke with Dr.Duncan and she recommends pt go to the MAU for evaluation. Pt expressed understanding.

## 2022-06-05 NOTE — MAU Provider Note (Signed)
Event Date/Time   First Provider Initiated Contact with Patient 06/05/22 1226       S: Ms. Debra Larsen is a 41 y.o. [phone] at [redacted]w[redacted]d  who presents to MAU today complaining of leaking of fluid since this morning at 530 am. She denies vaginal bleeding. She endorses contractions every 15-17 minutes. She reports normal fetal movement.    O: BP 111/61   Pulse 68   Temp 98.1 F (36.7 C) (Oral)   Resp 16   Ht 5\' 7"  (1.702 m)   Wt 77.3 kg   LMP 08/25/2021   SpO2 99%   BMI 26.70 kg/m  GENERAL: Well-developed, well-nourished female in no acute distress.  HEAD: Normocephalic, atraumatic.  CHEST: Normal effort of breathing, regular heart rate ABDOMEN: Soft, nontender, gravid PELVIC: Normal external female genitalia. Vagina is pink and rugated. Cervix with normal contour, no lesions. Normal discharge.  No pooling.   Cervical exam:  Dilation: 2.5 Effacement (%): 50 Station: -3 Presentation: Vertex Exam by:: Shaheer Bonfield np   Fetal Monitoring: Baseline: 125 Variability: moderate Accelerations: 15x15 Decelerations: no Contractions: irregular  Results for orders placed or performed during the hospital encounter of 06/05/22 (from the past 24 hour(s))  POCT fern test     Status: None   Collection Time: 06/05/22 12:52 PM  Result Value Ref Range   POCT Fern Test Negative = intact amniotic membranes      A: SIUP at [redacted]w[redacted]d  Membranes intact  P: Discharge home Labor precautions & kick counts   [redacted]w[redacted]d, NP 06/05/2022 12:57 PM

## 2022-06-05 NOTE — MAU Note (Signed)
Debra Larsen is a 41 y.o. at [redacted]w[redacted]d here in MAU reporting: LOF since 0530, states fluid is clear. Having irregular contractions. No bleeding. +FM  Onset of complaint: today  Pain score: 2-5/10  Vitals:   06/05/22 1200  BP: 109/62  Pulse: 72  Resp: 16  Temp: 98.1 F (36.7 C)  SpO2: 99%     FHT:EFM applied in room  Lab orders placed from triage: fern slide

## 2022-06-09 ENCOUNTER — Other Ambulatory Visit: Payer: Self-pay

## 2022-06-09 ENCOUNTER — Inpatient Hospital Stay (HOSPITAL_COMMUNITY)
Admission: AD | Admit: 2022-06-09 | Discharge: 2022-06-10 | DRG: 807 | Disposition: A | Discharge status: Home / Self Care | Payer: No Typology Code available for payment source | Attending: Family Medicine | Admitting: Family Medicine

## 2022-06-09 ENCOUNTER — Encounter (HOSPITAL_COMMUNITY): Payer: Self-pay | Admitting: Obstetrics and Gynecology

## 2022-06-09 DIAGNOSIS — O09523 Supervision of elderly multigravida, third trimester: Secondary | ICD-10-CM

## 2022-06-09 DIAGNOSIS — O480 Post-term pregnancy: Principal | ICD-10-CM | POA: Diagnosis present

## 2022-06-09 DIAGNOSIS — O4202 Full-term premature rupture of membranes, onset of labor within 24 hours of rupture: Secondary | ICD-10-CM

## 2022-06-09 DIAGNOSIS — O09529 Supervision of elderly multigravida, unspecified trimester: Secondary | ICD-10-CM

## 2022-06-09 DIAGNOSIS — Z3A41 41 weeks gestation of pregnancy: Secondary | ICD-10-CM

## 2022-06-09 DIAGNOSIS — Z3493 Encounter for supervision of normal pregnancy, unspecified, third trimester: Principal | ICD-10-CM

## 2022-06-09 LAB — CBC
HCT: 39.3 % (ref 36.0–46.0)
Hemoglobin: 13.7 g/dL (ref 12.0–15.0)
MCH: 33.2 pg (ref 26.0–34.0)
MCHC: 34.9 g/dL (ref 30.0–36.0)
MCV: 95.2 fL (ref 80.0–100.0)
Platelets: 282 10*3/uL (ref 150–400)
RBC: 4.13 MIL/uL (ref 3.87–5.11)
RDW: 13 % (ref 11.5–15.5)
WBC: 9.9 10*3/uL (ref 4.0–10.5)
nRBC: 0 % (ref 0.0–0.2)

## 2022-06-09 LAB — TYPE AND SCREEN
ABO/RH(D): O POS
Antibody Screen: NEGATIVE

## 2022-06-09 LAB — POCT FERN TEST: POCT Fern Test: NEGATIVE

## 2022-06-09 MED ORDER — ACETAMINOPHEN 325 MG PO TABS: 650.0000 mg | ORAL_TABLET | ORAL | Status: DC | PRN

## 2022-06-09 MED ORDER — OXYTOCIN-SODIUM CHLORIDE 30-0.9 UT/500ML-% IV SOLN: 2.5000 [IU]/h | INTRAVENOUS | Status: DC

## 2022-06-09 MED ORDER — OXYCODONE-ACETAMINOPHEN 5-325 MG PO TABS: 2.0000 | ORAL_TABLET | ORAL | Status: DC | PRN

## 2022-06-09 MED ORDER — OXYTOCIN 10 UNIT/ML IJ SOLN
INTRAMUSCULAR | Status: AC
  Administered 2022-06-09: 10 [IU]
  Filled 2022-06-09: qty 1

## 2022-06-09 MED ORDER — LACTATED RINGERS IV SOLN: 500.0000 mL | INTRAVENOUS | Status: DC | PRN

## 2022-06-09 MED ORDER — OXYCODONE-ACETAMINOPHEN 5-325 MG PO TABS: 1.0000 | ORAL_TABLET | ORAL | Status: DC | PRN

## 2022-06-09 MED ORDER — ONDANSETRON HCL 4 MG/2ML IJ SOLN: 4.0000 mg | Freq: Four times a day (QID) | INTRAMUSCULAR | Status: DC | PRN

## 2022-06-09 MED ORDER — SOD CITRATE-CITRIC ACID 500-334 MG/5ML PO SOLN: 30.0000 mL | ORAL | Status: DC | PRN

## 2022-06-09 MED ORDER — OXYTOCIN BOLUS FROM INFUSION: 333.0000 mL | Freq: Once | INTRAVENOUS | Status: DC

## 2022-06-09 MED ORDER — LACTATED RINGERS IV SOLN: INTRAVENOUS | Status: DC

## 2022-06-09 MED ORDER — OXYTOCIN 10 UNIT/ML IJ SOLN: 10.0000 [IU] | Freq: Once | INTRAMUSCULAR | Status: DC

## 2022-06-09 MED ORDER — LIDOCAINE HCL (PF) 1 % IJ SOLN: 30.0000 mL | INTRAMUSCULAR | Status: DC | PRN

## 2022-06-09 NOTE — Discharge Summary (Signed)
Postpartum Discharge Summary     Patient Name: Debra Larsen DOB: [birth year] MRN: [id number]  Date of admission: 06/09/2022 Delivery date:06/09/2022  Delivering provider: Serita Grammes D  Date of discharge: 06/10/2022  Admitting diagnosis: Indication for care in labor or delivery [O75.9] Intrauterine pregnancy: [redacted]w[redacted]d    Secondary diagnosis:  Active Problems:   Advanced maternal age in multigravida   Indication for care in labor or delivery  Additional problems: none    Discharge diagnosis: Term Pregnancy Delivered                                              Post partum procedures: none Augmentation: AROM Complications: None  Hospital course: Onset of Labor With Vaginal Delivery      41y.o. yo [id number] 41w1das admitted in Latent Labor on 06/09/2022. Labor course was uncomplicated with vag del in the tub approx 3hrs after AROM. Membrane Rupture Time/Date: 6:02 PM ,06/09/2022   Delivery Method:Vaginal, Spontaneous  Episiotomy: None  Lacerations:  None  Patient had a uncomplicated postpartum course. She is ambulating, tolerating a regular diet, passing flatus, and urinating well. Patient is discharged home in stable condition on 06/10/22 per her request for early d/c.  Newborn Data: Birth date:06/09/2022  Birth time:9:10 PM  Gender:Female  Living status:Living  Apgars:8 ,9  Weight:3680 g (8lb 1.8oz)  Magnesium Sulfate received: No BMZ received: No Rhophylac:N/A MMR:N/A T-DaP: declined Flu: No Transfusion:No  Physical exam  Vitals:   06/09/22 2218 06/09/22 2315 06/10/22 0015 06/10/22 0415  BP: (!) 114/54 126/60 110/61 111/75  Pulse: 71 96 76 77  Resp: _0 Temp:  98.7 F (37.1 C) 97.8 F (36.6 C) 98.1 F (36.7 C)  TempSrc:  Oral Oral Oral  SpO2:  98% 98% 97%  Weight:      Height:       General: alert and cooperative Lochia: appropriate Uterine Fundus: firm Incision: N/A DVT Evaluation: No evidence of DVT seen on physical  exam. Labs: Lab Results  Component Value Date   WBC 9.9 06/09/2022   HGB 13.7 06/09/2022   HCT 39.3 06/09/2022   MCV 95.2 06/09/2022   PLT 282 06/09/2022      Latest Ref Rng & Units 02/27/2013   12:01 PM  CMP  Glucose 70 - 99 mg/dL 76   BUN 6 - 23 mg/dL 10   Creatinine 0.50 - 1.10 mg/dL 0.70   Sodium 135 - 145 mEq/L 138   Potassium 3.5 - 5.3 mEq/L 4.1   Chloride 96 - 112 mEq/L 102   CO2 19 - 32 mEq/L 28   Calcium 8.4 - 10.5 mg/dL 9.2   Total Protein 6.0 - 8.3 g/dL 7.0   Total Bilirubin 0.3 - 1.2 mg/dL 0.7   Alkaline Phos 39 - 117 U/L 49   AST 0 - 37 U/L 13   ALT 0 - 35 U/L 8    Edinburgh Score:    06/09/2022   11:15 PM  Edinburgh Postnatal Depression Scale Screening Tool  I have been able to laugh and see the funny side of things. 0  I have looked forward with enjoyment to things. 0  I have blamed myself unnecessarily when things went wrong. 1  I have been anxious or worried for no good reason. 0  I have felt scared or panicky for no  good reason. 0  Things have been getting on top of me. 1  I have been so unhappy that I have had difficulty sleeping. 1  I have felt sad or miserable. 1  I have been so unhappy that I have been crying. 1  The thought of harming myself has occurred to me. 0  Edinburgh Postnatal Depression Scale Total 5     After visit meds:  Allergies as of 06/10/2022       Reactions   Other Other (See Comments)   Pt has a family hx of reactions to vaccines and chooses not to be vaccinated.  Pts mother has severe nerve damage due to vaccines.          Medication List     TAKE these medications    Cholecalciferol 50 MCG (2000 UT) Tabs Take by mouth.   Fish Oil 1000 MG Caps Fish Oil   ibuprofen 600 MG tablet Commonly known as: ADVIL Take 1 tablet (600 mg total) by mouth every 6 (six) hours as needed.   PRENATAL VITAMINS PO Take by mouth.   PROBIOTIC PRODUCT PO Take by mouth.   VITAMIN B COMPLEX PO vitamin B complex   vitamin C  100 MG tablet Take by mouth.         Discharge home in stable condition Infant Feeding: Breast Infant Disposition:home with mother Discharge instruction: per After Visit Summary and Postpartum booklet. Activity: Advance as tolerated. Pelvic rest for 6 weeks.  Diet: routine diet Future Appointments: Future Appointments  Date Time Provider [place]  06/13/2022  2:10 PM Rasch, Artist Pais, NP [facility]  06/13/2022  2:30 PM [place] [facility]   Follow up Visit:  Myrtis Ser, CNM  P [facility] Please schedule this patient for Postpartum visit in: 6 weeks with the following provider: CNM In-Person For C/S patients schedule nurse incision check in weeks 2 weeks: no Low risk pregnancy complicated by: AMA Delivery mode:  SVD Anticipated Birth Control:  other/unsure PP Procedures needed: none Schedule Integrated [place] visit: no  06/10/2022 Myrtis Ser, CNM 7:08 AM

## 2022-06-09 NOTE — H&P (Signed)
Debra Larsen is a 41 y.o. [phone] female at [redacted]w[redacted]d by LMP c/w 10w u/s presenting for light vag bleeding/? SROM this morning at 0500.   Reports active fetal movement, contractions: irreg/uncomfortable, vaginal bleeding: spotting, membranes: intact, sterile speculum exam showed some pink-tinged pooling but neg fern slide.  Initiated prenatal care at [facility] at 10 wks.   Most recent u/s : anatomy at [redacted]w[redacted]d, EFW 91%, nl fluid, post placenta.   This pregnancy complicated by: # AMA  Prenatal History/Complications:  # SVB 2016, 2017, 2019 without complication  Past Medical History: Past Medical History:  Diagnosis Date   AMA (advanced maternal age) multigravida 35+    Hx of varicella    Migraine     Past Surgical History: Past Surgical History:  Procedure Laterality Date   WISDOM TOOTH EXTRACTION      Obstetrical History: OB History     Gravida  4   Para  3   Term  3   Preterm      AB      Living  3      SAB      IAB      Ectopic      Multiple  0   Live Births  3           Social History: Social History   Socioeconomic History   Marital status: Married    Spouse name: Not on file   Number of children: Not on file   Years of education: Not on file   Highest education level: Not on file  Occupational History   Not on file  Tobacco Use   Smoking status: Never   Smokeless tobacco: Never  Vaping Use   Vaping Use: Never used  Substance and Sexual Activity   Alcohol use: Not Currently    Comment: Rare   Drug use: No   Sexual activity: Yes    Birth control/protection: None  Other Topics Concern   Not on file  Social History Narrative   Not on file   Social Determinants of Health   Financial Resource Strain: Not on file  Food Insecurity: No Food Insecurity (06/09/2022)   Hunger Vital Sign    Worried About Running Out of Food in the Last Year: Never true    Ran Out of Food in the Last Year: Never true  Transportation Needs: No  Transportation Needs (06/09/2022)   PRAPARE - Administrator, Civil Service (Medical): No    Lack of Transportation (Non-Medical): No  Physical Activity: Not on file  Stress: Not on file  Social Connections: Not on file    Family History: Family History  Problem Relation Age of Onset   Diabetes Maternal Grandmother     Allergies: Allergies  Allergen Reactions   Other Other (See Comments)    Pt has a family hx of reactions to vaccines and chooses not to be vaccinated.  Pts mother has severe nerve damage due to vaccines.      Medications Prior to Admission  Medication Sig Dispense Refill Last Dose   B Complex Vitamins (VITAMIN B COMPLEX PO) vitamin B complex   06/08/2022   Omega-3 Fatty Acids (FISH OIL) 1000 MG CAPS Fish Oil   06/08/2022   Prenatal Vit-Fe Fumarate-FA (PRENATAL VITAMINS PO) Take by mouth.   06/08/2022   PROBIOTIC PRODUCT PO Take by mouth.   06/08/2022   Ascorbic Acid (VITAMIN C) 100 MG tablet Take by mouth.      Cholecalciferol  50 MCG (2000 UT) TABS Take by mouth.       Review of Systems  Pertinent pos/neg as indicated in HPI  Blood pressure 119/80, pulse 74, temperature 98.1 F (36.7 C), temperature source Oral, resp. rate 18, height 5\' 7"  (1.702 m), weight 77.1 kg, last menstrual period 08/25/2021, SpO2 96 %, unknown if currently breastfeeding. General appearance: alert, cooperative, and no distress Lungs: clear to auscultation bilaterally Heart: regular rate and rhythm Abdomen: gravid, soft, non-tender, EFW by Leopold's approximately 8lbs Extremities: tr edema  Fetal monitoring: FHR: 125-130s bpm, variability: moderate,  Accelerations: Present,  decelerations:  Absent Uterine activity: irreg Dilation: 3.5 Effacement (%): 70 Station: -2 Exam by:: Dr. [email or web address] Presentation: cephalic   Prenatal labs: ABO, Rh: --/--/O POS (11/24 1557) Antibody: NEG (11/24 1557) Rubella: 2.65 (04/21 1025) RPR: NON-REACTIVE (04/21 1025)  HBsAg:  NON-REACTIVE (04/21 1025)  HIV:   (declined) GBS:   neg (05/09/22) 2hr GTT: normal home glucose testing  Prenatal Transfer Tool  Maternal Diabetes: No Genetic Screening: Declined Maternal Ultrasounds/Referrals: Normal Fetal Ultrasounds or other Referrals:  None Maternal Substance Abuse:  No Significant Maternal Medications:  None Significant Maternal Lab Results: Group B Strep negative  Results for orders placed or performed during the hospital encounter of 06/09/22 (from the past 24 hour(s))  Fern Test   Collection Time: 06/09/22  3:33 PM  Result Value Ref Range   POCT Fern Test Negative = intact amniotic membranes   CBC   Collection Time: 06/09/22  3:57 PM  Result Value Ref Range   WBC 9.9 4.0 - 10.5 K/uL   RBC 4.13 3.87 - 5.11 MIL/uL   Hemoglobin 13.7 12.0 - 15.0 g/dL   HCT 06/11/22 78.6 - 76.7 %   MCV 95.2 80.0 - 100.0 fL   MCH 33.2 26.0 - 34.0 pg   MCHC 34.9 30.0 - 36.0 g/dL   RDW 20.9 47.0 - 96.2 %   Platelets 282 150 - 400 K/uL   nRBC 0.0 0.0 - 0.2 %  Type and screen [facility]   Collection Time: 06/09/22  3:57 PM  Result Value Ref Range   ABO/RH(D) O POS    Antibody Screen NEG    Sample Expiration      06/12/2022,2359 Performed at [facility], [address], [address], [place]      Assessment:  [redacted]w[redacted]d SIUP  [phone]  ? ROM/pink-tinged fluid  Cat 1 FHR  GBS negative   Plan:  Admit to L&D  Open to AROM of bag/forebag; would like to have tub filled and ready prior to this due to her hx of rapid labors  Waterbirth class documented under media 04/11/22; no contraindications at present that would risk out of WB  Anticipate vag del   Plans to breastfeed  Contraception: declines  Circumcision: declines  04/13/22 CNM 06/09/2022, 5:27 PM

## 2022-06-09 NOTE — MAU Provider Note (Signed)
History     CSN: [id number]  Arrival date and time: 06/09/22 1427   None     Chief Complaint  Patient presents with   Contractions   Vaginal Bleeding   Patient is presenting for evaluation for vaginal bleeding.  Reports that she woke up this morning at 5 AM and noticed a gush of fluid and it was pink-tinged.  Fluid became more red tinge throughout the day.  Denies any clots.  Patient is hoping for a little to no intervention and would like a water birth.  Reports other than the gush of fluid she is also having regular contractions but they are not close together.  States that she was seen on Monday of this week and was 2 and half centimeters dilated.    OB History     Gravida  4   Para  3   Term  3   Preterm      AB      Living  3      SAB      IAB      Ectopic      Multiple  0   Live Births  3           Past Medical History:  Diagnosis Date   AMA (advanced maternal age) multigravida 35+    Hx of varicella    Migraine     Past Surgical History:  Procedure Laterality Date   WISDOM TOOTH EXTRACTION      Family History  Problem Relation Age of Onset   Diabetes Maternal Grandmother     Social History   Tobacco Use   Smoking status: Never   Smokeless tobacco: Never  Vaping Use   Vaping Use: Never used  Substance Use Topics   Alcohol use: Not Currently    Comment: Rare   Drug use: No    Allergies:  Allergies  Allergen Reactions   Other Other (See Comments)    Pt has a family hx of reactions to vaccines and chooses not to be vaccinated.  Pts mother has severe nerve damage due to vaccines.      Medications Prior to Admission  Medication Sig Dispense Refill Last Dose   B Complex Vitamins (VITAMIN B COMPLEX PO) vitamin B complex   06/08/2022   Omega-3 Fatty Acids (FISH OIL) 1000 MG CAPS Fish Oil   06/08/2022   Prenatal Vit-Fe Fumarate-FA (PRENATAL VITAMINS PO) Take by mouth.   06/08/2022   PROBIOTIC PRODUCT PO Take by mouth.    06/08/2022   Ascorbic Acid (VITAMIN C) 100 MG tablet Take by mouth.      Cholecalciferol 50 MCG (2000 UT) TABS Take by mouth.       Review of Systems  Gastrointestinal:  Positive for abdominal pain (contractions).  Genitourinary:  Positive for vaginal bleeding and vaginal discharge.  All other systems reviewed and are negative.  Physical Exam   Blood pressure 111/74, pulse 84, temperature 98.1 F (36.7 C), temperature source Oral, resp. rate 14, weight 77.2 kg, last menstrual period 08/25/2021, SpO2 96 %, unknown if currently breastfeeding.  Physical Exam Vitals reviewed.  Constitutional:      Appearance: Normal appearance.  HENT:     Head: Normocephalic.     Mouth/Throat:     Mouth: Mucous membranes are moist.  Cardiovascular:     Rate and Rhythm: Normal rate.  Pulmonary:     Effort: Pulmonary effort is normal.  Abdominal:     Comments: gravid  Genitourinary:  Vagina: Vaginal discharge (clear pink tinged fluid on speculum exam) present.  Musculoskeletal:        General: Normal range of motion.  Skin:    General: Skin is warm.     Capillary Refill: Capillary refill takes less than 2 seconds.  Neurological:     Mental Status: She is alert.     MAU Course  Procedures  MDM Fern test  Assessment and Plan  Debra Larsen is a 41 yo G4P3 presenting for leaking fluid and vaginal bleeding.  Discussed with the patient regarding our recommendations when both evaluated and she is postdates.  Fern test sowed no ferning. Patient is agreeable to come in for labor but would like minimal intervention and is hoping for a water birth. Discussed an amnisure to verify if patient is actually ruptured but patient declined. Is open to ROM. Notified floor provider and placed admission orders.   Celedonio Savage 06/09/2022, 3:42 PM

## 2022-06-09 NOTE — MAU Note (Addendum)
.  Debra Larsen is a 41 y.o. at [redacted]w[redacted]d here in MAU reporting: started having bloody show this morning like the beginning of a period. Also reports some irregular ctx. LOF since 0500. +FM.   Pain score: 4

## 2022-06-09 NOTE — MAU Note (Signed)
Patient declines IV access at this time

## 2022-06-10 LAB — RPR: RPR Ser Ql: NONREACTIVE

## 2022-06-10 MED ORDER — IBUPROFEN 600 MG PO TABS: 600.0000 mg | ORAL_TABLET | Freq: Four times a day (QID) | ORAL | 0 refills | Status: DC | PRN

## 2022-06-10 MED ORDER — ONDANSETRON HCL 4 MG/2ML IJ SOLN: 4.0000 mg | INTRAMUSCULAR | Status: DC | PRN

## 2022-06-10 MED ORDER — ACETAMINOPHEN 325 MG PO TABS: 650.0000 mg | ORAL_TABLET | ORAL | Status: DC | PRN

## 2022-06-10 MED ORDER — TETANUS-DIPHTH-ACELL PERTUSSIS 5-2.5-18.5 LF-MCG/0.5 IM SUSY: 0.5000 mL | PREFILLED_SYRINGE | Freq: Once | INTRAMUSCULAR | Status: DC

## 2022-06-10 MED ORDER — ONDANSETRON HCL 4 MG PO TABS: 4.0000 mg | ORAL_TABLET | ORAL | Status: DC | PRN

## 2022-06-10 MED ORDER — DIBUCAINE (PERIANAL) 1 % EX OINT: 1.0000 | TOPICAL_OINTMENT | CUTANEOUS | Status: DC | PRN

## 2022-06-10 MED ORDER — IBUPROFEN 600 MG PO TABS
600.0000 mg | ORAL_TABLET | Freq: Four times a day (QID) | ORAL | Status: DC
  Administered 2022-06-10 (×2): 600 mg via ORAL
  Filled 2022-06-10 (×4): qty 1

## 2022-06-10 MED ORDER — PRENATAL MULTIVITAMIN CH
1.0000 | ORAL_TABLET | Freq: Every day | ORAL | Status: DC
  Filled 2022-06-10: qty 1

## 2022-06-10 MED ORDER — DIPHENHYDRAMINE HCL 25 MG PO CAPS: 25.0000 mg | ORAL_CAPSULE | Freq: Four times a day (QID) | ORAL | Status: DC | PRN

## 2022-06-10 MED ORDER — ZOLPIDEM TARTRATE 5 MG PO TABS: 5.0000 mg | ORAL_TABLET | Freq: Every evening | ORAL | Status: DC | PRN

## 2022-06-10 MED ORDER — COCONUT OIL OIL: 1.0000 | TOPICAL_OIL | Status: DC | PRN

## 2022-06-10 MED ORDER — MEASLES, MUMPS & RUBELLA VAC IJ SOLR: 0.5000 mL | Freq: Once | INTRAMUSCULAR | Status: DC

## 2022-06-10 MED ORDER — OXYCODONE HCL 5 MG PO TABS: 5.0000 mg | ORAL_TABLET | ORAL | Status: DC | PRN

## 2022-06-10 MED ORDER — WITCH HAZEL-GLYCERIN EX PADS: 1.0000 | MEDICATED_PAD | CUTANEOUS | Status: DC | PRN

## 2022-06-10 MED ORDER — SENNOSIDES-DOCUSATE SODIUM 8.6-50 MG PO TABS
2.0000 | ORAL_TABLET | ORAL | Status: DC
  Administered 2022-06-10: 2 via ORAL
  Filled 2022-06-10: qty 2

## 2022-06-10 MED ORDER — SIMETHICONE 80 MG PO CHEW: 80.0000 mg | CHEWABLE_TABLET | ORAL | Status: DC | PRN

## 2022-06-10 MED ORDER — BENZOCAINE-MENTHOL 20-0.5 % EX AERO: 1.0000 | INHALATION_SPRAY | CUTANEOUS | Status: DC | PRN

## 2022-06-10 NOTE — Lactation Note (Signed)
This note was copied from a baby's chart. Lactation Consultation Note  Patient Name: Debra Larsen [id number] Date: 06/10/2022 Reason for consult: Initial assessment;Mother's request;Term;Breastfeeding assistance Age:41 hours  LC entered the room and the infant was laying next to the birth parent.  Per the birth parent, things have been going well with breastfeeding.  The birth parent stated she has not been in any pain and has no questions or concerns.  The birth parent asked the LC to assess the infant's oral cavity.  The infant was able to move her tongue past the gumline and her upper lip is able to flange when feeding.  LC reviewed the outpatient services brochure with the parents.  LC left her name on the board and the parents will call for breastfeeding assistance if needed.   Infant Feeding Plan:  Breastfeed 8+ times in 24 hours according to feeding cues.  Hand express and spoon feed expressed milk to the infant.  Call RN/LC for assistance with breastfeeding.   Maternal Data Has patient been taught Hand Expression?: Yes Does the patient have breastfeeding experience prior to this delivery?: Yes How long did the patient breastfeed?: Breastfed all 3 of her older children for 14-15 months  Feeding Mother's Current Feeding Choice: Breast Milk  LATCH Score Latch: Grasps breast easily, tongue down, lips flanged, rhythmical sucking.  Audible Swallowing: Spontaneous and intermittent  Type of Nipple: Everted at rest and after stimulation  Comfort (Breast/Nipple): Soft / non-tender  Hold (Positioning): No assistance needed to correctly position infant at breast.  LATCH Score: 10   Lactation Tools Discussed/Used    Interventions Interventions: LC Services brochure  Discharge Pump: DEBP;Personal  Consult Status Consult Status: Follow-up Date: 06/11/22 Follow-up type: In-patient    Orvil Feil Mahesh Sizemore 06/10/2022, 10:20 AM

## 2022-06-10 NOTE — Lactation Note (Signed)
This note was copied from a baby's chart. Lactation Consultation Note  Patient Name: Girl Shatyra Becka Today's Date: 06/10/2022   Age:41 hours Per Birth Parent, she would like to be seen by LC in morning but not tonight, feels infant latches well but would like infant's  oral anatomy assessed in morning.  Maternal Data    Feeding    LATCH Score                    Lactation Tools Discussed/Used    Interventions    Discharge    Consult Status      Frederico Hamman 06/10/2022, 1:00 AM

## 2022-06-13 ENCOUNTER — Other Ambulatory Visit: Payer: No Typology Code available for payment source

## 2022-06-13 ENCOUNTER — Encounter: Payer: No Typology Code available for payment source | Admitting: Obstetrics and Gynecology

## 2022-06-17 ENCOUNTER — Telehealth (HOSPITAL_COMMUNITY): Payer: Self-pay

## 2022-06-17 NOTE — Telephone Encounter (Addendum)
Patient left a voicemail, returning her call.  Patient reports feeling pretty good. Patient has questions about her bleeding. RN reviewed normal lochia amounts, color, and duration of bleeding. RN also reviewed what to much bleeding looks like. Patient declines any other questions/concerns about her health and healing.  Patient reports that baby is doing wonderful. "She has gained past her birthweight and is breastfeeding great.". Baby sleeps in a "her bed". RN reviewed ABC's of safe sleep with patient. Patient declines any questions or concerns about baby.  RN reviewed EPDS with patient. She states that she is feeling the same and when she took it before. Patient declines wanting to do EPDS.   Marcelino Duster [facility] 06/17/22, [phone]

## 2022-06-17 NOTE — Telephone Encounter (Signed)
Patient did not answer phone call. Voicemail left for patient.   Marcelino Duster [facility] 06/17/2022,0936

## 2022-07-25 ENCOUNTER — Encounter: Payer: Self-pay | Admitting: Obstetrics and Gynecology

## 2022-07-25 ENCOUNTER — Ambulatory Visit (INDEPENDENT_AMBULATORY_CARE_PROVIDER_SITE_OTHER): Payer: Self-pay | Admitting: Obstetrics and Gynecology

## 2022-07-25 NOTE — Progress Notes (Signed)
    [place] Partum Visit Note  Debra Larsen is a 42 y.o. [phone] female who presents for a postpartum visit. She is 7 weeks postpartum following a normal spontaneous vaginal delivery.  I have fully reviewed the prenatal and intrapartum course. The delivery was at 41.1 gestational weeks.  Anesthesia: none. Postpartum course has been unremarkable. Baby is doing well. Baby is feeding by breast. Bleeding staining only. Bowel function is normal. Bladder function is normal. Patient is not sexually active. Contraception method is none. Postpartum depression screening: negative.   The pregnancy intention screening data noted above was reviewed. Potential methods of contraception were discussed. The patient elected to proceed with No data recorded.    Health Maintenance Due  Topic Date Due   DTaP/Tdap/Td (1 - Tdap) Never done   INFLUENZA VACCINE  Never done    The following portions of the patient's history were reviewed and updated as appropriate: allergies, current medications, past family history, past medical history, past social history, past surgical history, and problem list.  Review of Systems Pertinent items are noted in HPI.  Objective:  LMP 08/25/2021    General:  alert   Breasts:  not indicated  Abdomen: soft, non-tender; bowel sounds normal; no masses,  no organomegaly   GU exam:  normal       Assessment:    Normal postpartum exam.  Declines BC at this time   Plan:   Essential components of care per ACOG recommendations:  1.  Mood and well being: Patient with negative depression screening today. Reviewed local resources for support.  - Patient tobacco use? No.   - hx of drug use? No.    2. Infant care and feeding:  -Patient currently breastmilk feeding? Yes. Reviewed importance of draining breast regularly to support lactation.     3. Sexuality, contraception and birth spacing - Patient does not want a pregnancy in the next year.   - Reviewed reproductive  life planning. Reviewed contraceptive methods based on pt preferences and effectiveness.  Patient desired No Method - Other Reason today.   - Discussed birth spacing of 18 months  4. Sleep and fatigue -Encouraged family/partner/community support of 4 hrs of uninterrupted sleep to help with mood and fatigue  5. Physical Recovery  - Discussed patients delivery and complications. She describes her labor as good. - Patient had a Vaginal, no problems at delivery.  Perineal healing reviewed. Patient expressed understanding - Patient has urinary incontinence? No. - Patient is safe to resume physical and sexual activity  6.  Health Maintenance - HM due items addressed  - Last pap smear 05/07/2021 normal  -Breast Cancer screening indicated? Yes: mammo ordered  7. Chronic Disease/Pregnancy Condition follow up: None  - PCP follow up  Lyndal Rainbow, [place] for [facility], [place], NP 07/25/2022 4:46 PM

## 2022-09-05 ENCOUNTER — Ambulatory Visit (INDEPENDENT_AMBULATORY_CARE_PROVIDER_SITE_OTHER): Payer: Self-pay | Admitting: Obstetrics and Gynecology

## 2022-09-05 ENCOUNTER — Encounter: Payer: Self-pay | Admitting: Obstetrics and Gynecology

## 2022-09-05 VITALS — BP 115/73 | HR 67 | Temp 98.7°F | Ht 67.0 in | Wt 154.0 lb

## 2022-09-05 DIAGNOSIS — N63 Unspecified lump in unspecified breast: Secondary | ICD-10-CM

## 2022-09-05 NOTE — Progress Notes (Signed)
GYNECOLOGYENCOUNTER NOTE  History:     Debra Larsen is a 42 y.o. [phone] female 12 week postpartum with concerns regarding right breast nodules/ discomfort that started a days ago. She did just recently start working out and baby has been sleeping more on occasion. The pain feels like it could be a pulled muscle. She has been massaging the area which has contributed to more discomfort. She reports discomfort in the right axilla and near her right rib cage, near her bra line. No skin color changes, fever, chills. Breasts are emptying appropriately.  She is exclusively breastfeeding.   Obstetric History OB History  Gravida Para Term Preterm AB Living  4 4 4     4  $ SAB IAB Ectopic Multiple Live Births        0 4    # Outcome Date GA Lbr Len/2nd Weight Sex Delivery Anes PTL Lv  4 Term 06/09/22 17w1d09:46 / 00:24 8 lb 1.8 oz (3.68 kg) F Vag-Spont None  LIV     Birth Comments: WDL  3 Term 06/12/18 382w6d 00:02 7 lb 3.6 oz (3.277 kg) F Vag-Spont Local  LIV  2 Term 07/16/16 3931w6d:42 / 00:03 7 lb 15 oz (3.6 kg) M Vag-Spont EPI  LIV  1 Term 11/21/14 40w58w4d20 / 01:25 8 lb 12.4 oz (3.98 kg) M Vag-Spont EPI  LIV    Past Medical History:  Diagnosis Date   AMA (advanced maternal age) multigravida 35+    Hx of varicella    Migraine     Past Surgical History:  Procedure Laterality Date   WISDOM TOOTH EXTRACTION      Current Outpatient Medications on File Prior to Visit  Medication Sig Dispense Refill   ASCORBIC ACID PO Take 1 tablet by mouth in the morning. Vitamin C, unknown strength. (Patient not taking: Reported on 07/25/2022)     B Complex Vitamins (VITAMIN B COMPLEX PO) Take 1 capsule by mouth in the morning. (Patient not taking: Reported on 07/25/2022)     Cholecalciferol (VITAMIN D-3 PO) Take 1 capsule by mouth in the morning. (Patient not taking: Reported on 07/25/2022)     ibuprofen (ADVIL) 600 MG tablet Take 1 tablet (600 mg total) by mouth every 6 (six) hours as needed.  (Patient not taking: Reported on 07/25/2022) 30 tablet 0   Omega-3 Fatty Acids (FISH OIL PO) Take 1 capsule by mouth in the morning. (Patient not taking: Reported on 07/25/2022)     Prenatal Vit-Fe Fumarate-FA (PRENATAL PO) Take 1 tablet by mouth in the morning. (Patient not taking: Reported on 07/25/2022)     Probiotic Product (PROBIOTIC PO) Take 1 capsule by mouth in the morning. (Patient not taking: Reported on 07/25/2022)     No current facility-administered medications on file prior to visit.    Allergies  Allergen Reactions   Other Other (See Comments)    Pt has a family hx of reactions to vaccines and chooses not to be vaccinated.  Pts mother has severe nerve damage due to vaccines.      Social History:  reports that she has never smoked. She has never used smokeless tobacco. She reports that she does not currently use alcohol. She reports that she does not use drugs.  Family History  Problem Relation Age of Onset   Diabetes Maternal Grandmother     The following portions of the patient's history were reviewed and updated as appropriate: allergies, current medications, past family history, past medical history,  past social history, past surgical history and problem list.  Review of Systems Pertinent items noted in HPI and remainder of comprehensive ROS otherwise negative.  Physical Exam:  BP 115/73   Pulse 67   Temp 98.7 F (37.1 C)   Ht 5' 7"$  (1.702 m)   Wt 154 lb (69.9 kg)   Breastfeeding Yes   BMI 24.12 kg/m  CONSTITUTIONAL: Well-developed, well-nourished female in no acute distress.  HENT:  Normocephalic NECK: Normal range of motion SKIN: Skin is warm and dry.  BREASTS: Symmetric in size. No skin changes, nipple drainage, or lymphadenopathy bilaterally. Right breast nodule 2-3 mm in size near 11 'clock, above the areola. Some tenderness with palpation to the right axilla. Several ?fibrocystic nodules noted in the left breast. No skin changes. No dimpling of nipple.    Performed in the presence of a chaperone.    Assessment and Plan:   1. Breast nodule  - US BREAST COMPLETE UNI RIGHT INC AXILLA; Future  - No recent mammogram. She is not interested in mammogram d/t breastfeeding. Informed Amarrah that the breast center may request a mammo prior to an [place]. She is aware.       Tuyet Bader, Artist Pais, [place] for [employer], [place]

## 2023-01-12 ENCOUNTER — Ambulatory Visit
Admission: EM | Admit: 2023-01-12 | Discharge: 2023-01-12 | Disposition: A | Discharge status: Home / Self Care | Payer: Self-pay | Attending: Family Medicine | Admitting: Family Medicine

## 2023-01-12 DIAGNOSIS — N949 Unspecified condition associated with female genital organs and menstrual cycle: Secondary | ICD-10-CM

## 2023-01-12 LAB — POCT URINALYSIS DIP (MANUAL ENTRY)
Bilirubin, UA: NEGATIVE
Blood, UA: NEGATIVE
Glucose, UA: NEGATIVE mg/dL
Ketones, POC UA: NEGATIVE mg/dL
Leukocytes, UA: NEGATIVE
Nitrite, UA: NEGATIVE
Protein Ur, POC: NEGATIVE mg/dL
Spec Grav, UA: 1.01 (ref 1.010–1.025)
Urobilinogen, UA: 0.2 E.U./dL
pH, UA: 6.5 (ref 5.0–8.0)

## 2023-01-12 LAB — POCT URINE PREGNANCY: Preg Test, Ur: NEGATIVE

## 2023-01-12 NOTE — ED Triage Notes (Signed)
Pt presents to uc with co of soreness and vaginal discomfort. Pt reports she had a vaginal delivery 7 months ago and thinks its muscle related since she is starting to work out again but just wants to be sure. Pt denies any dysuria.

## 2023-01-12 NOTE — ED Provider Notes (Signed)
[facility]    CSN: [id number] Arrival date & time: 01/12/23  1131      History   Chief Complaint Chief Complaint  Patient presents with   pelvic soreness    HPI Debra Larsen is a 42 y.o. female.    HPI Pleasant 42 year old female presents with complains of soreness and vaginal discomfort.  Patient reports may be muscle related and is just beginning to work out again.  PMH significant for AMA and migraine.  Past Medical History:  Diagnosis Date   AMA (advanced maternal age) multigravida 35+    Hx of varicella    Migraine     Patient Active Problem List   Diagnosis Date Noted   Vaginal delivery 06/09/2022   Advanced maternal age in multigravida 11/06/2021   Supervision of normal pregnancy 11/04/2021    Past Surgical History:  Procedure Laterality Date   WISDOM TOOTH EXTRACTION      OB History     Gravida  4   Para  4   Term  4   Preterm      AB      Living  4      SAB      IAB      Ectopic      Multiple  0   Live Births  4            Home Medications    Prior to Admission medications   Not on File    Family History Family History  Problem Relation Age of Onset   Diabetes Maternal Grandmother     Social History Social History   Tobacco Use   Smoking status: Never   Smokeless tobacco: Never  Vaping Use   Vaping Use: Never used  Substance Use Topics   Alcohol use: Not Currently    Comment: Rare   Drug use: No     Allergies   Other   Review of Systems Review of Systems  Genitourinary:  Positive for dysuria.  All other systems reviewed and are negative.    Physical Exam Triage Vital Signs ED Triage Vitals  Enc Vitals Group     BP      Pulse      Resp      Temp      Temp src      SpO2      Weight      Height      Head Circumference      Peak Flow      Pain Score      Pain Loc      Pain Edu?      Excl. in GC?    No data found.  Updated Vital Signs BP 100/67   Pulse (!) 59    Temp 98.3 F (36.8 C)   Resp 16   SpO2 98%   Breastfeeding Yes    Physical Exam Vitals and nursing note reviewed.  Constitutional:      Appearance: Normal appearance. She is normal weight.  HENT:     Head: Normocephalic and atraumatic.     Mouth/Throat:     Mouth: Mucous membranes are moist.     Pharynx: Oropharynx is clear.  Eyes:     Extraocular Movements: Extraocular movements intact.     Conjunctiva/sclera: Conjunctivae normal.     Pupils: Pupils are equal, round, and reactive to light.  Cardiovascular:     Pulses: Normal pulses.     Heart sounds: Normal  heart sounds.  Pulmonary:     Effort: Pulmonary effort is normal.     Breath sounds: Normal breath sounds. No wheezing or rhonchi.  Abdominal:     Tenderness: There is no right CVA tenderness or left CVA tenderness.  Musculoskeletal:        General: Normal range of motion.     Cervical back: Normal range of motion and neck supple.  Skin:    General: Skin is warm and dry.  Neurological:     General: No focal deficit present.     Mental Status: She is alert and oriented to person, place, and time. Mental status is at baseline.  Psychiatric:        Mood and Affect: Mood normal.        Behavior: Behavior normal.        Thought Content: Thought content normal.      UC Treatments / Results  Labs (all labs ordered are listed, but only abnormal results are displayed) Labs Reviewed  POCT URINALYSIS DIP (MANUAL ENTRY) - Abnormal  POCT URINE PREGNANCY    EKG   Radiology No results found.  Procedures Procedures (including critical care time)  Medications Ordered in UC Medications - No data to display  Initial Impression / Assessment and Plan / UC Course  I have reviewed the triage vital signs and the nursing notes.  Pertinent labs & imaging results that were available during my care of the patient were reviewed by me and considered in my medical decision making (see chart for details).     MDM: 1.   Vaginal discomfort-UA revealed above, urine pregnancy negative. Patient UA was unremarkable/normal today and urine culture would not be necessary.  Advised if symptoms worsen and/or unresolved please follow-up with GYN for further evaluation of vaginal discomfort/warmth.  Final Clinical Impressions(s) / UC Diagnoses   Final diagnoses:  Vaginal discomfort     Discharge Instructions      Patient UA was unremarkable/normal today and urine culture would not be necessary.  Advised if symptoms worsen and/or unresolved please follow-up with GYN for further evaluation of vaginal discomfort/warmth.     ED Prescriptions   None    PDMP not reviewed this encounter.   Trevor Iha, FNP 01/12/23 1222

## 2023-01-12 NOTE — Discharge Instructions (Addendum)
Patient UA was unremarkable/normal today and urine culture would not be necessary.  Advised if symptoms worsen and/or unresolved please follow-up with GYN for further evaluation of vaginal discomfort/warmth.
